# Patient Record
Sex: Male | Born: 1990 | Race: Black or African American | Hispanic: No | Marital: Single | Smoking: Never smoker
Health system: Southern US, Community
[De-identification: ages and names within clinical notes are randomized; demographics above are authoritative.]

---

## 2020-12-25 ENCOUNTER — Inpatient Hospital Stay (HOSPITAL_COMMUNITY): Payer: Non-veteran care

## 2020-12-25 ENCOUNTER — Emergency Department (HOSPITAL_COMMUNITY): Payer: Non-veteran care

## 2020-12-25 ENCOUNTER — Inpatient Hospital Stay (HOSPITAL_COMMUNITY): Payer: Non-veteran care | Admitting: Certified Registered Nurse Anesthetist

## 2020-12-25 ENCOUNTER — Inpatient Hospital Stay (HOSPITAL_COMMUNITY): Payer: Non-veteran care | Admitting: Anesthesiology

## 2020-12-25 ENCOUNTER — Encounter (HOSPITAL_COMMUNITY): Admission: EM | Disposition: A | Discharge status: Rehabilitation Facility | Payer: Self-pay | Source: Home / Self Care

## 2020-12-25 ENCOUNTER — Inpatient Hospital Stay (HOSPITAL_COMMUNITY)
Admission: EM | Admit: 2020-12-25 | Discharge: 2021-01-08 | DRG: 956 | Disposition: A | Discharge status: Rehabilitation Facility | Payer: Non-veteran care | Attending: General Surgery | Admitting: General Surgery

## 2020-12-25 DIAGNOSIS — R42 Dizziness and giddiness: Secondary | ICD-10-CM | POA: Diagnosis not present

## 2020-12-25 DIAGNOSIS — S82202B Unspecified fracture of shaft of left tibia, initial encounter for open fracture type I or II: Secondary | ICD-10-CM | POA: Diagnosis present

## 2020-12-25 DIAGNOSIS — D62 Acute posthemorrhagic anemia: Secondary | ICD-10-CM | POA: Diagnosis present

## 2020-12-25 DIAGNOSIS — S61411A Laceration without foreign body of right hand, initial encounter: Secondary | ICD-10-CM | POA: Diagnosis present

## 2020-12-25 DIAGNOSIS — S270XXA Traumatic pneumothorax, initial encounter: Secondary | ICD-10-CM | POA: Diagnosis present

## 2020-12-25 DIAGNOSIS — S5291XA Unspecified fracture of right forearm, initial encounter for closed fracture: Secondary | ICD-10-CM

## 2020-12-25 DIAGNOSIS — S42121A Displaced fracture of acromial process, right shoulder, initial encounter for closed fracture: Secondary | ICD-10-CM | POA: Diagnosis present

## 2020-12-25 DIAGNOSIS — S82202C Unspecified fracture of shaft of left tibia, initial encounter for open fracture type IIIA, IIIB, or IIIC: Secondary | ICD-10-CM

## 2020-12-25 DIAGNOSIS — S72302C Unspecified fracture of shaft of left femur, initial encounter for open fracture type IIIA, IIIB, or IIIC: Principal | ICD-10-CM | POA: Diagnosis present

## 2020-12-25 DIAGNOSIS — S82402B Unspecified fracture of shaft of left fibula, initial encounter for open fracture type I or II: Secondary | ICD-10-CM | POA: Diagnosis present

## 2020-12-25 DIAGNOSIS — I959 Hypotension, unspecified: Secondary | ICD-10-CM | POA: Diagnosis present

## 2020-12-25 DIAGNOSIS — S51811A Laceration without foreign body of right forearm, initial encounter: Secondary | ICD-10-CM | POA: Diagnosis present

## 2020-12-25 DIAGNOSIS — I748 Embolism and thrombosis of other arteries: Secondary | ICD-10-CM | POA: Diagnosis present

## 2020-12-25 DIAGNOSIS — S143XXD Injury of brachial plexus, subsequent encounter: Secondary | ICD-10-CM | POA: Diagnosis not present

## 2020-12-25 DIAGNOSIS — Z23 Encounter for immunization: Secondary | ICD-10-CM

## 2020-12-25 DIAGNOSIS — S72302S Unspecified fracture of shaft of left femur, sequela: Secondary | ICD-10-CM | POA: Diagnosis not present

## 2020-12-25 DIAGNOSIS — S7290XB Unspecified fracture of unspecified femur, initial encounter for open fracture type I or II: Secondary | ICD-10-CM | POA: Diagnosis present

## 2020-12-25 DIAGNOSIS — Z09 Encounter for follow-up examination after completed treatment for conditions other than malignant neoplasm: Secondary | ICD-10-CM

## 2020-12-25 DIAGNOSIS — T794XXA Traumatic shock, initial encounter: Secondary | ICD-10-CM | POA: Diagnosis present

## 2020-12-25 DIAGNOSIS — T1490XA Injury, unspecified, initial encounter: Secondary | ICD-10-CM

## 2020-12-25 DIAGNOSIS — K5900 Constipation, unspecified: Secondary | ICD-10-CM | POA: Diagnosis present

## 2020-12-25 DIAGNOSIS — S143XXA Injury of brachial plexus, initial encounter: Secondary | ICD-10-CM | POA: Diagnosis present

## 2020-12-25 DIAGNOSIS — K5903 Drug induced constipation: Secondary | ICD-10-CM | POA: Diagnosis not present

## 2020-12-25 DIAGNOSIS — S45891A Other specified injury of other specified blood vessels at shoulder and upper arm level, right arm, initial encounter: Secondary | ICD-10-CM | POA: Diagnosis not present

## 2020-12-25 DIAGNOSIS — S143XXS Injury of brachial plexus, sequela: Secondary | ICD-10-CM | POA: Diagnosis not present

## 2020-12-25 DIAGNOSIS — Z20822 Contact with and (suspected) exposure to covid-19: Secondary | ICD-10-CM | POA: Diagnosis present

## 2020-12-25 DIAGNOSIS — S7292XB Unspecified fracture of left femur, initial encounter for open fracture type I or II: Secondary | ICD-10-CM

## 2020-12-25 DIAGNOSIS — J939 Pneumothorax, unspecified: Secondary | ICD-10-CM

## 2020-12-25 DIAGNOSIS — I6389 Other cerebral infarction: Secondary | ICD-10-CM | POA: Diagnosis not present

## 2020-12-25 DIAGNOSIS — S25101A Unspecified injury of right innominate or subclavian artery, initial encounter: Secondary | ICD-10-CM

## 2020-12-25 DIAGNOSIS — S72002D Fracture of unspecified part of neck of left femur, subsequent encounter for closed fracture with routine healing: Secondary | ICD-10-CM | POA: Diagnosis not present

## 2020-12-25 DIAGNOSIS — S82402C Unspecified fracture of shaft of left fibula, initial encounter for open fracture type IIIA, IIIB, or IIIC: Secondary | ICD-10-CM

## 2020-12-25 HISTORY — PX: FEMUR IM NAIL: SHX1597

## 2020-12-25 HISTORY — PX: VEIN HARVEST: SHX6363

## 2020-12-25 HISTORY — PX: INCISION AND DRAINAGE OF WOUND: SHX1803

## 2020-12-25 HISTORY — PX: TIBIA IM NAIL INSERTION: SHX2516

## 2020-12-25 HISTORY — PX: I & D EXTREMITY: SHX5045

## 2020-12-25 HISTORY — PX: CAROTID-SUBCLAVIAN BYPASS GRAFT: SHX910

## 2020-12-25 HISTORY — PX: SUBCLAVIAN ANGIOGRAM: SHX5350

## 2020-12-25 LAB — COMPREHENSIVE METABOLIC PANEL
ALT: 18 U/L (ref 0–44)
AST: 27 U/L (ref 15–41)
Albumin: 3.9 g/dL (ref 3.5–5.0)
Alkaline Phosphatase: 48 U/L (ref 38–126)
Anion gap: 8 (ref 5–15)
BUN: 10 mg/dL (ref 6–20)
CO2: 22 mmol/L (ref 22–32)
Calcium: 9 mg/dL (ref 8.9–10.3)
Chloride: 107 mmol/L (ref 98–111)
Creatinine, Ser: 1.5 mg/dL — ABNORMAL HIGH (ref 0.61–1.24)
GFR, Estimated: >60 mL/min (ref >60)
Glucose, Bld: 157 mg/dL — ABNORMAL HIGH (ref 70–99)
Potassium: 3.7 mmol/L (ref 3.5–5.1)
Sodium: 137 mmol/L (ref 135–145)
Total Bilirubin: 0.7 mg/dL (ref 0.3–1.2)
Total Protein: 6.6 g/dL (ref 6.5–8.1)

## 2020-12-25 LAB — URINALYSIS, ROUTINE W REFLEX MICROSCOPIC
Bacteria, UA: NONE SEEN
Bilirubin Urine: NEGATIVE
Glucose, UA: NEGATIVE mg/dL
Ketones, ur: NEGATIVE mg/dL
Leukocytes,Ua: NEGATIVE
Nitrite: NEGATIVE
Protein, ur: 30 mg/dL — AB
Specific Gravity, Urine: 1.024 (ref 1.005–1.030)
pH: 6 (ref 5.0–8.0)

## 2020-12-25 LAB — CBC
HCT: 44.2 % (ref 39.0–52.0)
Hemoglobin: 14.6 g/dL (ref 13.0–17.0)
MCH: 32.1 pg (ref 26.0–34.0)
MCHC: 33 g/dL (ref 30.0–36.0)
MCV: 97.1 fL (ref 80.0–100.0)
Platelets: 266 10*3/uL (ref 150–400)
RBC: 4.55 MIL/uL (ref 4.22–5.81)
RDW: 11.7 % (ref 11.5–15.5)
WBC: 6.6 10*3/uL (ref 4.0–10.5)
nRBC: 0 % (ref 0.0–0.2)

## 2020-12-25 LAB — PROTIME-INR
INR: 1.1 (ref 0.8–1.2)
Prothrombin Time: 14 seconds (ref 11.4–15.2)

## 2020-12-25 LAB — I-STAT CHEM 8, ED
BUN: 11 mg/dL (ref 6–20)
Calcium, Ion: 1.2 mmol/L (ref 1.15–1.40)
Chloride: 105 mmol/L (ref 98–111)
Creatinine, Ser: 1.4 mg/dL — ABNORMAL HIGH (ref 0.61–1.24)
Glucose, Bld: 154 mg/dL — ABNORMAL HIGH (ref 70–99)
HCT: 43 % (ref 39.0–52.0)
Hemoglobin: 14.6 g/dL (ref 13.0–17.0)
Potassium: 3.7 mmol/L (ref 3.5–5.1)
Sodium: 141 mmol/L (ref 135–145)
TCO2: 25 mmol/L (ref 22–32)

## 2020-12-25 LAB — RESP PANEL BY RT-PCR (FLU A&B, COVID) ARPGX2
Influenza A by PCR: NEGATIVE
Influenza B by PCR: NEGATIVE
SARS Coronavirus 2 by RT PCR: NEGATIVE

## 2020-12-25 LAB — ABO/RH: ABO/RH(D): O POS

## 2020-12-25 LAB — LACTIC ACID, PLASMA
Lactic Acid, Venous: 2.5 mmol/L (ref 0.5–1.9)
Lactic Acid, Venous: 3.7 mmol/L (ref 0.5–1.9)

## 2020-12-25 LAB — ETHANOL: Alcohol, Ethyl (B): <10 mg/dL (ref <10)

## 2020-12-25 SURGERY — INSERTION, INTRAMEDULLARY ROD, TIBIA
Anesthesia: General | Site: Leg Upper | Laterality: Right

## 2020-12-25 SURGERY — CREATION, BYPASS, ARTERIAL, SUBCLAVIAN TO CAROTID, USING GRAFT
Anesthesia: General | Site: Leg Upper | Laterality: Right

## 2020-12-25 MED ORDER — PAPAVERINE HCL 30 MG/ML IJ SOLN
INTRAMUSCULAR | Status: DC | PRN
  Administered 2020-12-25: 60 mg

## 2020-12-25 MED ORDER — SUGAMMADEX SODIUM 200 MG/2ML IV SOLN
INTRAVENOUS | Status: DC | PRN
  Administered 2020-12-25: 300 mg via INTRAVENOUS

## 2020-12-25 MED ORDER — KETAMINE HCL 50 MG/5ML IJ SOSY: 0.3000 mg/kg | PREFILLED_SYRINGE | Freq: Once | INTRAMUSCULAR | Status: AC

## 2020-12-25 MED ORDER — DEXMEDETOMIDINE HCL IN NACL 200 MCG/50ML IV SOLN
0.4000 ug/kg/h | INTRAVENOUS | Status: DC
  Filled 2020-12-25 (×2): qty 50

## 2020-12-25 MED ORDER — HEPARIN SODIUM (PORCINE) 1000 UNIT/ML IJ SOLN
INTRAMUSCULAR | Status: AC
  Filled 2020-12-25: qty 2

## 2020-12-25 MED ORDER — FENTANYL CITRATE (PF) 250 MCG/5ML IJ SOLN
INTRAMUSCULAR | Status: DC | PRN
  Administered 2020-12-25: 100 ug via INTRAVENOUS
  Administered 2020-12-25 (×3): 50 ug via INTRAVENOUS
  Administered 2020-12-25: 150 ug via INTRAVENOUS
  Administered 2020-12-25: 100 ug via INTRAVENOUS
  Administered 2020-12-25: 50 ug via INTRAVENOUS
  Administered 2020-12-25: 100 ug via INTRAVENOUS

## 2020-12-25 MED ORDER — CEFAZOLIN SODIUM-DEXTROSE 1-4 GM/50ML-% IV SOLN
INTRAVENOUS | Status: AC | PRN
  Administered 2020-12-25: 2 g via INTRAVENOUS

## 2020-12-25 MED ORDER — NITROGLYCERIN IN D5W 200-5 MCG/ML-% IV SOLN
INTRAVENOUS | Status: AC
  Filled 2020-12-25: qty 250

## 2020-12-25 MED ORDER — TETANUS-DIPHTH-ACELL PERTUSSIS 5-2.5-18.5 LF-MCG/0.5 IM SUSY
0.5000 mL | PREFILLED_SYRINGE | Freq: Once | INTRAMUSCULAR | Status: AC
  Administered 2020-12-25: 0.5 mL via INTRAMUSCULAR

## 2020-12-25 MED ORDER — PROTAMINE SULFATE 10 MG/ML IV SOLN
INTRAVENOUS | Status: AC
  Filled 2020-12-25: qty 5

## 2020-12-25 MED ORDER — LIDOCAINE 2% (20 MG/ML) 5 ML SYRINGE
INTRAMUSCULAR | Status: DC | PRN
  Administered 2020-12-25: 20 mg via INTRAVENOUS

## 2020-12-25 MED ORDER — SODIUM CHLORIDE 0.9 % IV SOLN
INTRAVENOUS | Status: AC
  Filled 2020-12-25: qty 1.2

## 2020-12-25 MED ORDER — HYDROMORPHONE HCL 1 MG/ML IJ SOLN
INTRAMUSCULAR | Status: AC
  Filled 2020-12-25: qty 1

## 2020-12-25 MED ORDER — HEPARIN SODIUM (PORCINE) 1000 UNIT/ML IJ SOLN
INTRAMUSCULAR | Status: AC
  Filled 2020-12-25: qty 1

## 2020-12-25 MED ORDER — PHENYLEPHRINE 40 MCG/ML (10ML) SYRINGE FOR IV PUSH (FOR BLOOD PRESSURE SUPPORT)
PREFILLED_SYRINGE | INTRAVENOUS | Status: DC | PRN
  Administered 2020-12-25: 80 ug via INTRAVENOUS

## 2020-12-25 MED ORDER — SUCCINYLCHOLINE CHLORIDE 200 MG/10ML IV SOSY
PREFILLED_SYRINGE | INTRAVENOUS | Status: AC
  Filled 2020-12-25: qty 10

## 2020-12-25 MED ORDER — ROCURONIUM BROMIDE 10 MG/ML (PF) SYRINGE
PREFILLED_SYRINGE | INTRAVENOUS | Status: DC | PRN
  Administered 2020-12-25: 100 mg via INTRAVENOUS
  Administered 2020-12-25 (×2): 50 mg via INTRAVENOUS

## 2020-12-25 MED ORDER — PROTAMINE SULFATE 10 MG/ML IV SOLN
INTRAVENOUS | Status: DC | PRN
  Administered 2020-12-25: 40 mg via INTRAVENOUS

## 2020-12-25 MED ORDER — VASOPRESSIN 20 UNIT/ML IV SOLN
INTRAVENOUS | Status: AC
  Filled 2020-12-25: qty 1

## 2020-12-25 MED ORDER — IODIXANOL 320 MG/ML IV SOLN
INTRAVENOUS | Status: DC | PRN
  Administered 2020-12-25: 18 mL

## 2020-12-25 MED ORDER — SUCCINYLCHOLINE CHLORIDE 200 MG/10ML IV SOSY
PREFILLED_SYRINGE | INTRAVENOUS | Status: DC | PRN
  Administered 2020-12-25: 200 mg via INTRAVENOUS

## 2020-12-25 MED ORDER — SODIUM CHLORIDE 0.9 % IV SOLN
INTRAVENOUS | Status: DC | PRN
  Administered 2020-12-25: 500 mL

## 2020-12-25 MED ORDER — PROPOFOL 10 MG/ML IV BOLUS
INTRAVENOUS | Status: DC | PRN
  Administered 2020-12-25 (×4): 50 mg via INTRAVENOUS

## 2020-12-25 MED ORDER — DEXMEDETOMIDINE (PRECEDEX) IN NS 20 MCG/5ML (4 MCG/ML) IV SYRINGE
PREFILLED_SYRINGE | INTRAVENOUS | Status: DC | PRN
  Administered 2020-12-25: 12 ug via INTRAVENOUS
  Administered 2020-12-25: 8 ug via INTRAVENOUS

## 2020-12-25 MED ORDER — ONDANSETRON HCL 4 MG/2ML IJ SOLN
INTRAMUSCULAR | Status: DC | PRN
  Administered 2020-12-25: 4 mg via INTRAVENOUS

## 2020-12-25 MED ORDER — HEPARIN SODIUM (PORCINE) 1000 UNIT/ML IJ SOLN
INTRAMUSCULAR | Status: DC | PRN
  Administered 2020-12-25: 6000 [IU] via INTRAVENOUS

## 2020-12-25 MED ORDER — VANCOMYCIN HCL 1000 MG IV SOLR
INTRAVENOUS | Status: AC
  Filled 2020-12-25: qty 1000

## 2020-12-25 MED ORDER — LIDOCAINE 2% (20 MG/ML) 5 ML SYRINGE
INTRAMUSCULAR | Status: AC
  Filled 2020-12-25: qty 5

## 2020-12-25 MED ORDER — LACTATED RINGERS IV SOLN: INTRAVENOUS | Status: DC | PRN

## 2020-12-25 MED ORDER — HYDROMORPHONE HCL 1 MG/ML IJ SOLN
0.2500 mg | INTRAMUSCULAR | Status: DC | PRN
  Administered 2020-12-25 – 2020-12-26 (×3): 0.5 mg via INTRAVENOUS

## 2020-12-25 MED ORDER — NITROGLYCERIN FOR UTERINE RELAXATION OPTIME 200MCG/ML
INTRAVENOUS | Status: DC | PRN
  Administered 2020-12-25: 200 ug via INTRAVENOUS

## 2020-12-25 MED ORDER — PAPAVERINE HCL 30 MG/ML IJ SOLN
INTRAMUSCULAR | Status: AC
  Filled 2020-12-25: qty 2

## 2020-12-25 MED ORDER — FENTANYL CITRATE (PF) 250 MCG/5ML IJ SOLN
INTRAMUSCULAR | Status: AC
  Filled 2020-12-25: qty 5

## 2020-12-25 MED ORDER — SODIUM CHLORIDE 0.9 % IR SOLN
Status: DC | PRN
  Administered 2020-12-25 (×4): 3000 mL

## 2020-12-25 MED ORDER — CEFAZOLIN SODIUM-DEXTROSE 2-3 GM-%(50ML) IV SOLR
INTRAVENOUS | Status: DC | PRN
  Administered 2020-12-25 (×2): 2 g via INTRAVENOUS

## 2020-12-25 MED ORDER — SODIUM CHLORIDE 0.9 % IV SOLN: INTRAVENOUS | Status: DC | PRN

## 2020-12-25 MED ORDER — ACETAMINOPHEN 10 MG/ML IV SOLN
INTRAVENOUS | Status: DC | PRN
  Administered 2020-12-25: 1000 mg via INTRAVENOUS

## 2020-12-25 MED ORDER — SODIUM CHLORIDE 0.9 % IV SOLN
INTRAVENOUS | Status: AC | PRN
  Administered 2020-12-25: 2000 mL via INTRAVENOUS

## 2020-12-25 MED ORDER — SODIUM CHLORIDE (PF) 0.9 % IJ SOLN
INTRAMUSCULAR | Status: AC
  Filled 2020-12-25: qty 20

## 2020-12-25 MED ORDER — KETAMINE HCL 50 MG/5ML IJ SOSY
PREFILLED_SYRINGE | INTRAMUSCULAR | Status: AC
  Administered 2020-12-25: 24 mg via INTRAVENOUS
  Filled 2020-12-25: qty 5

## 2020-12-25 MED ORDER — KETAMINE HCL 50 MG/5ML IJ SOSY
PREFILLED_SYRINGE | INTRAMUSCULAR | Status: AC
  Filled 2020-12-25: qty 5

## 2020-12-25 MED ORDER — POTASSIUM CHLORIDE IN NACL 20-0.9 MEQ/L-% IV SOLN
INTRAVENOUS | Status: DC
  Filled 2020-12-25 (×2): qty 1000

## 2020-12-25 MED ORDER — VANCOMYCIN HCL 1000 MG IV SOLR
INTRAVENOUS | Status: DC | PRN
  Administered 2020-12-25: 1000 mg

## 2020-12-25 MED ORDER — ALBUMIN HUMAN 5 % IV SOLN: INTRAVENOUS | Status: DC | PRN

## 2020-12-25 MED ORDER — ACETAMINOPHEN 10 MG/ML IV SOLN
INTRAVENOUS | Status: AC
  Filled 2020-12-25: qty 100

## 2020-12-25 MED ORDER — FENTANYL CITRATE (PF) 100 MCG/2ML IJ SOLN
INTRAMUSCULAR | Status: AC | PRN
  Administered 2020-12-25 (×2): 50 ug via INTRAVENOUS

## 2020-12-25 MED ORDER — 0.9 % SODIUM CHLORIDE (POUR BTL) OPTIME
TOPICAL | Status: DC | PRN
  Administered 2020-12-25: 2000 mL

## 2020-12-25 MED ORDER — PHENYLEPHRINE 40 MCG/ML (10ML) SYRINGE FOR IV PUSH (FOR BLOOD PRESSURE SUPPORT)
PREFILLED_SYRINGE | INTRAVENOUS | Status: AC
  Filled 2020-12-25: qty 10

## 2020-12-25 MED ORDER — MIDAZOLAM HCL 5 MG/5ML IJ SOLN
INTRAMUSCULAR | Status: DC | PRN
  Administered 2020-12-25: 2 mg via INTRAVENOUS

## 2020-12-25 MED ORDER — ROCURONIUM BROMIDE 10 MG/ML (PF) SYRINGE
PREFILLED_SYRINGE | INTRAVENOUS | Status: AC
  Filled 2020-12-25: qty 20

## 2020-12-25 MED ORDER — PHENYLEPHRINE HCL-NACL 10-0.9 MG/250ML-% IV SOLN
INTRAVENOUS | Status: DC | PRN
  Administered 2020-12-25: 20 ug/min via INTRAVENOUS
  Administered 2020-12-25: 40 ug/min via INTRAVENOUS

## 2020-12-25 MED ORDER — MIDAZOLAM HCL 2 MG/2ML IJ SOLN
INTRAMUSCULAR | Status: AC
  Filled 2020-12-25: qty 2

## 2020-12-25 MED ORDER — CEFAZOLIN SODIUM 1 G IJ SOLR
INTRAMUSCULAR | Status: AC
  Filled 2020-12-25: qty 40

## 2020-12-25 MED ORDER — TRANEXAMIC ACID 1000 MG/10ML IV SOLN
1.5000 mg/kg/h | INTRAVENOUS | Status: DC
  Filled 2020-12-25: qty 25

## 2020-12-25 MED ORDER — IOHEXOL 350 MG/ML SOLN
100.0000 mL | Freq: Once | INTRAVENOUS | Status: AC | PRN
  Administered 2020-12-25: 100 mL via INTRAVENOUS

## 2020-12-25 MED ORDER — KETAMINE HCL 10 MG/ML IJ SOLN
INTRAMUSCULAR | Status: DC | PRN
  Administered 2020-12-25: 30 mg via INTRAVENOUS

## 2020-12-25 MED ORDER — FENTANYL CITRATE (PF) 100 MCG/2ML IJ SOLN
INTRAMUSCULAR | Status: AC
  Filled 2020-12-25: qty 2

## 2020-12-25 SURGICAL SUPPLY — 121 items
ADH SKN CLS APL DERMABOND .7 (GAUZE/BANDAGES/DRESSINGS) ×3
APL PRP STRL LF DISP 70% ISPRP (MISCELLANEOUS)
BAG BANDED W/RUBBER/TAPE 36X54 (MISCELLANEOUS) ×4 IMPLANT
BAG DECANTER FOR FLEXI CONT (MISCELLANEOUS) ×4 IMPLANT
BAG EQP BAND 135X91 W/RBR TAPE (MISCELLANEOUS) ×3
BAG ISL DRAPE 18X18 STRL (DRAPES) ×3
BAG ISOLATION DRAPE 18X18 (DRAPES) ×3 IMPLANT
BLADE SURG 10 STRL SS (BLADE) IMPLANT
BLANKET WARM CARDIAC ADLT BAIR (MISCELLANEOUS) ×4 IMPLANT
BNDG COHESIVE 4X5 TAN STRL (GAUZE/BANDAGES/DRESSINGS) ×4 IMPLANT
BNDG ELASTIC 4X5.8 VLCR STR LF (GAUZE/BANDAGES/DRESSINGS) IMPLANT
BNDG ELASTIC 6X5.8 VLCR STR LF (GAUZE/BANDAGES/DRESSINGS) IMPLANT
BNDG GAUZE ELAST 4 BULKY (GAUZE/BANDAGES/DRESSINGS) ×16 IMPLANT
BRUSH SCRUB EZ PLAIN DRY (MISCELLANEOUS) ×8 IMPLANT
CANISTER SUCT 3000ML PPV (MISCELLANEOUS) ×4 IMPLANT
CANNULA VESSEL 3MM 2 BLNT TIP (CANNULA) ×4 IMPLANT
CATH ANGIO BERNSTEIN 5X65X.035 (CATHETERS) ×4 IMPLANT
CATH ROBINSON RED A/P 18FR (CATHETERS) ×8 IMPLANT
CHLORAPREP W/TINT 26 (MISCELLANEOUS) IMPLANT
CLIP VESOCCLUDE MED 24/CT (CLIP) ×4 IMPLANT
CLIP VESOCCLUDE SM WIDE 24/CT (CLIP) ×8 IMPLANT
CNTNR URN SCR LID CUP LEK RST (MISCELLANEOUS) ×3 IMPLANT
CONT SPEC 4OZ STRL OR WHT (MISCELLANEOUS) ×4
COVER DOME SNAP 22 D (MISCELLANEOUS) ×4 IMPLANT
COVER MAYO STAND STRL (DRAPES) ×4 IMPLANT
COVER SURGICAL LIGHT HANDLE (MISCELLANEOUS) ×4 IMPLANT
COVER WAND RF STERILE (DRAPES) ×8 IMPLANT
DERMABOND ADVANCED (GAUZE/BANDAGES/DRESSINGS) ×1
DERMABOND ADVANCED .7 DNX12 (GAUZE/BANDAGES/DRESSINGS) ×3 IMPLANT
DEVICE TORQUE H2O (MISCELLANEOUS) ×4 IMPLANT
DRAIN CHANNEL 15F RND FF W/TCR (WOUND CARE) IMPLANT
DRAPE C-ARM 42X72 X-RAY (DRAPES) ×4 IMPLANT
DRAPE C-ARMOR (DRAPES) ×4 IMPLANT
DRAPE HALF SHEET 40X57 (DRAPES) ×8 IMPLANT
DRAPE IMP U-DRAPE 54X76 (DRAPES) IMPLANT
DRAPE INCISE IOBAN 66X45 STRL (DRAPES) IMPLANT
DRAPE ISOLATION BAG 18X18 (DRAPES) ×1
DRAPE ORTHO SPLIT 77X108 STRL (DRAPES) ×8
DRAPE SURG 17X23 STRL (DRAPES) ×4 IMPLANT
DRAPE SURG ORHT 6 SPLT 77X108 (DRAPES) ×6 IMPLANT
DRAPE U-SHAPE 47X51 STRL (DRAPES) ×4 IMPLANT
DRSG ADAPTIC 3X8 NADH LF (GAUZE/BANDAGES/DRESSINGS) ×4 IMPLANT
DRSG COVADERM 4X10 (GAUZE/BANDAGES/DRESSINGS) ×12 IMPLANT
DRSG COVADERM 4X6 (GAUZE/BANDAGES/DRESSINGS) ×4 IMPLANT
ELECT REM PT RETURN 9FT ADLT (ELECTROSURGICAL) ×8
ELECTRODE REM PT RTRN 9FT ADLT (ELECTROSURGICAL) ×6 IMPLANT
EVACUATOR 1/8 PVC DRAIN (DRAIN) IMPLANT
EVACUATOR SILICONE 100CC (DRAIN) IMPLANT
GAUZE SPONGE 4X4 12PLY STRL (GAUZE/BANDAGES/DRESSINGS) ×8 IMPLANT
GLIDEWIRE ADV .035X180CM (WIRE) ×4 IMPLANT
GLOVE BIO SURGEON STRL SZ 6.5 (GLOVE) ×12 IMPLANT
GLOVE BIO SURGEON STRL SZ7.5 (GLOVE) ×20 IMPLANT
GLOVE BIOGEL PI IND STRL 7.5 (GLOVE) ×3 IMPLANT
GLOVE BIOGEL PI INDICATOR 7.5 (GLOVE) ×1
GLOVE SRG 8 PF TXTR STRL LF DI (GLOVE) ×3 IMPLANT
GLOVE SURG UNDER POLY LF SZ6.5 (GLOVE) ×4 IMPLANT
GLOVE SURG UNDER POLY LF SZ8 (GLOVE) ×4
GOWN STRL REUS W/ TWL LRG LVL3 (GOWN DISPOSABLE) ×15 IMPLANT
GOWN STRL REUS W/TWL LRG LVL3 (GOWN DISPOSABLE) ×20
HANDPIECE INTERPULSE COAX TIP (DISPOSABLE)
INSERT FOGARTY SM (MISCELLANEOUS) ×4 IMPLANT
KIT BASIN OR (CUSTOM PROCEDURE TRAY) ×8 IMPLANT
KIT DRAIN CSF ACCUDRAIN (MISCELLANEOUS) IMPLANT
KIT MICROPUNCTURE NIT STIFF (SHEATH) ×8 IMPLANT
KIT TURNOVER KIT B (KITS) ×8 IMPLANT
LOOP VESSEL MAXI BLUE (MISCELLANEOUS) ×8 IMPLANT
MANIFOLD NEPTUNE II (INSTRUMENTS) IMPLANT
NEEDLE 18GX1X1/2 (RX/OR ONLY) (NEEDLE) ×4 IMPLANT
NEEDLE 22X1 1/2 (OR ONLY) (NEEDLE) ×4 IMPLANT
NS IRRIG 1000ML POUR BTL (IV SOLUTION) ×12 IMPLANT
PACK CAROTID (CUSTOM PROCEDURE TRAY) ×4 IMPLANT
PACK ORTHO EXTREMITY (CUSTOM PROCEDURE TRAY) IMPLANT
PACK TOTAL JOINT (CUSTOM PROCEDURE TRAY) IMPLANT
PAD ARMBOARD 7.5X6 YLW CONV (MISCELLANEOUS) ×16 IMPLANT
PADDING CAST COTTON 6X4 STRL (CAST SUPPLIES) IMPLANT
POSITIONER HEAD DONUT 9IN (MISCELLANEOUS) ×4 IMPLANT
SET HNDPC FAN SPRY TIP SCT (DISPOSABLE) IMPLANT
SHEATH BRITE TIP 7FRX11 (SHEATH) ×4 IMPLANT
SHEATH PINNACLE 5F 10CM (SHEATH) ×12 IMPLANT
SHUNT CAROTID BYPASS 10 (VASCULAR PRODUCTS) IMPLANT
SHUNT CAROTID BYPASS 12 (VASCULAR PRODUCTS) IMPLANT
SPONGE LAP 18X18 RF (DISPOSABLE) ×8 IMPLANT
SPONGE SURGIFOAM ABS GEL 100 (HEMOSTASIS) IMPLANT
STAPLER VISISTAT 35W (STAPLE) ×8 IMPLANT
STOCKINETTE 4X48 STRL (DRAPES) ×4 IMPLANT
STOPCOCK 4 WAY LG BORE MALE ST (IV SETS) ×8 IMPLANT
SUT ETHILON 2 0 FS 18 (SUTURE) IMPLANT
SUT ETHILON 3 0 PS 1 (SUTURE) IMPLANT
SUT MNCRL AB 3-0 PS2 18 (SUTURE) ×4 IMPLANT
SUT MNCRL AB 4-0 PS2 18 (SUTURE) ×4 IMPLANT
SUT MON AB 2-0 CT1 36 (SUTURE) IMPLANT
SUT PDS AB 0 CT 36 (SUTURE) IMPLANT
SUT PROLENE 5 0 C 1 24 (SUTURE) ×4 IMPLANT
SUT PROLENE 6 0 BV (SUTURE) ×20 IMPLANT
SUT PROLENE 7 0 BV 1 (SUTURE) IMPLANT
SUT SILK 2 0 PERMA HAND 18 BK (SUTURE) IMPLANT
SUT SILK 2 0 SH (SUTURE) ×4 IMPLANT
SUT SILK 3 0 (SUTURE) ×4
SUT SILK 3-0 18XBRD TIE 12 (SUTURE) ×3 IMPLANT
SUT VIC AB 0 CT1 27 (SUTURE)
SUT VIC AB 0 CT1 27XBRD ANBCTR (SUTURE) IMPLANT
SUT VIC AB 2-0 CT1 27 (SUTURE)
SUT VIC AB 2-0 CT1 TAPERPNT 27 (SUTURE) IMPLANT
SUT VIC AB 3-0 SH 27 (SUTURE) ×16
SUT VIC AB 3-0 SH 27X BRD (SUTURE) ×12 IMPLANT
SWAB CULTURE ESWAB REG 1ML (MISCELLANEOUS) IMPLANT
SYR 10ML LL (SYRINGE) ×12 IMPLANT
SYR 20CC LL (SYRINGE) ×8 IMPLANT
SYR 3ML LL SCALE MARK (SYRINGE) ×4 IMPLANT
SYR CONTROL 10ML LL (SYRINGE) ×4 IMPLANT
TOWEL GREEN STERILE (TOWEL DISPOSABLE) ×12 IMPLANT
TOWEL GREEN STERILE FF (TOWEL DISPOSABLE) ×4 IMPLANT
TRAY FOLEY MTR SLVR 16FR STAT (SET/KITS/TRAYS/PACK) ×4 IMPLANT
TUBE CONNECTING 12X1/4 (SUCTIONS) ×4 IMPLANT
TUBE CONNECTING 20X1/4 (TUBING) ×4 IMPLANT
TUBING CIL FLEX 10 FLL-RA (TUBING) ×4 IMPLANT
TUBING INJECTOR 48 (MISCELLANEOUS) ×4 IMPLANT
UNDERPAD 30X36 HEAVY ABSORB (UNDERPADS AND DIAPERS) ×4 IMPLANT
WATER STERILE IRR 1000ML POUR (IV SOLUTION) ×8 IMPLANT
WIRE STARTER BENTSON 035X150 (WIRE) ×4 IMPLANT
YANKAUER SUCT BULB TIP NO VENT (SUCTIONS) IMPLANT

## 2020-12-25 SURGICAL SUPPLY — 87 items
APL PRP STRL LF DISP 70% ISPRP (MISCELLANEOUS) ×3
BIT DRILL CALIBRATED 4.2 (BIT) ×6 IMPLANT
BIT DRILL SHORT 4.2 (BIT) ×9 IMPLANT
BLADE SURG 10 STRL SS (BLADE) ×12 IMPLANT
BNDG CMPR MED 10X6 ELC LF (GAUZE/BANDAGES/DRESSINGS) ×3
BNDG COHESIVE 4X5 TAN STRL (GAUZE/BANDAGES/DRESSINGS) ×4 IMPLANT
BNDG COHESIVE 6X5 TAN STRL LF (GAUZE/BANDAGES/DRESSINGS) IMPLANT
BNDG ELASTIC 4X5.8 VLCR STR LF (GAUZE/BANDAGES/DRESSINGS) ×4 IMPLANT
BNDG ELASTIC 6X10 VLCR STRL LF (GAUZE/BANDAGES/DRESSINGS) ×4 IMPLANT
BNDG ELASTIC 6X5.8 VLCR STR LF (GAUZE/BANDAGES/DRESSINGS) ×4 IMPLANT
BNDG GAUZE ELAST 4 BULKY (GAUZE/BANDAGES/DRESSINGS) ×4 IMPLANT
BRUSH SCRUB EZ PLAIN DRY (MISCELLANEOUS) ×16 IMPLANT
CANISTER WOUND CARE 500ML ATS (WOUND CARE) ×4 IMPLANT
CHLORAPREP W/TINT 26 (MISCELLANEOUS) ×4 IMPLANT
COVER SURGICAL LIGHT HANDLE (MISCELLANEOUS) ×8 IMPLANT
COVER WAND RF STERILE (DRAPES) ×4 IMPLANT
DRAPE C-ARM 35X43 STRL (DRAPES) ×4 IMPLANT
DRAPE C-ARM 42X72 X-RAY (DRAPES) ×4 IMPLANT
DRAPE C-ARMOR (DRAPES) ×4 IMPLANT
DRAPE HALF SHEET 40X57 (DRAPES) ×16 IMPLANT
DRAPE IMP U-DRAPE 54X76 (DRAPES) ×8 IMPLANT
DRAPE INCISE IOBAN 66X45 STRL (DRAPES) ×4 IMPLANT
DRAPE ORTHO SPLIT 77X108 STRL (DRAPES) ×8
DRAPE SURG 17X23 STRL (DRAPES) ×4 IMPLANT
DRAPE SURG ORHT 6 SPLT 77X108 (DRAPES) ×6 IMPLANT
DRAPE U-SHAPE 47X51 STRL (DRAPES) ×4 IMPLANT
DRESSING MEPILEX FLEX 4X4 (GAUZE/BANDAGES/DRESSINGS) ×3 IMPLANT
DRESSING PEEL AND PLC PRVNA 13 (GAUZE/BANDAGES/DRESSINGS) ×3 IMPLANT
DRILL BIT CALIBRATED 4.2 (BIT) ×8
DRILL BIT SHORT 4.2 (BIT) ×12
DRSG ADAPTIC 3X8 NADH LF (GAUZE/BANDAGES/DRESSINGS) ×4 IMPLANT
DRSG COVADERM 4X10 (GAUZE/BANDAGES/DRESSINGS) ×4 IMPLANT
DRSG MEPILEX BORDER 4X4 (GAUZE/BANDAGES/DRESSINGS) ×12 IMPLANT
DRSG MEPILEX BORDER 4X8 (GAUZE/BANDAGES/DRESSINGS) ×4 IMPLANT
DRSG MEPILEX FLEX 4X4 (GAUZE/BANDAGES/DRESSINGS) ×4
DRSG MEPITEL 4X7.2 (GAUZE/BANDAGES/DRESSINGS) ×8 IMPLANT
DRSG PEEL AND PLACE PREVENA 13 (GAUZE/BANDAGES/DRESSINGS) ×4
ELECT REM PT RETURN 9FT ADLT (ELECTROSURGICAL) ×4
ELECTRODE REM PT RTRN 9FT ADLT (ELECTROSURGICAL) ×3 IMPLANT
GAUZE SPONGE 4X4 12PLY STRL (GAUZE/BANDAGES/DRESSINGS) ×4 IMPLANT
GAUZE SPONGE 4X4 12PLY STRL LF (GAUZE/BANDAGES/DRESSINGS) ×4 IMPLANT
GLOVE BIO SURGEON STRL SZ 6.5 (GLOVE) ×20 IMPLANT
GLOVE BIO SURGEON STRL SZ7.5 (GLOVE) ×24 IMPLANT
GLOVE BIOGEL PI IND STRL 7.5 (GLOVE) ×6 IMPLANT
GLOVE BIOGEL PI INDICATOR 7.5 (GLOVE) ×2
GLOVE SURG ENC MOIS LTX SZ8.5 (GLOVE) ×8 IMPLANT
GLOVE SURG UNDER POLY LF SZ6.5 (GLOVE) ×4 IMPLANT
GOWN STRL REUS W/ TWL LRG LVL3 (GOWN DISPOSABLE) ×15 IMPLANT
GOWN STRL REUS W/ TWL XL LVL3 (GOWN DISPOSABLE) ×3 IMPLANT
GOWN STRL REUS W/TWL LRG LVL3 (GOWN DISPOSABLE) ×20
GOWN STRL REUS W/TWL XL LVL3 (GOWN DISPOSABLE) ×4
GUIDEWIRE 3.2X400 (WIRE) ×8 IMPLANT
KIT BASIN OR (CUSTOM PROCEDURE TRAY) ×4 IMPLANT
KIT TURNOVER KIT B (KITS) ×4 IMPLANT
MANIFOLD NEPTUNE II (INSTRUMENTS) ×4 IMPLANT
NAIL RFNA BEND 9X380 5D (Nail) ×4 IMPLANT
NAIL TIB TFNA STRL 9X360 (Nail) ×4 IMPLANT
NS IRRIG 1000ML POUR BTL (IV SOLUTION) ×4 IMPLANT
PACK GENERAL/GYN (CUSTOM PROCEDURE TRAY) ×4 IMPLANT
PACK TOTAL JOINT (CUSTOM PROCEDURE TRAY) ×4 IMPLANT
PAD ARMBOARD 7.5X6 YLW CONV (MISCELLANEOUS) ×8 IMPLANT
PAD CAST 4YDX4 CTTN HI CHSV (CAST SUPPLIES) ×3 IMPLANT
PADDING CAST COTTON 4X4 STRL (CAST SUPPLIES) ×4
PADDING CAST COTTON 6X4 STRL (CAST SUPPLIES) ×4 IMPLANT
REAMER ROD DEEP FLUTE 2.5X950 (INSTRUMENTS) ×4 IMPLANT
SCREW LOCK IM 5X36 (Screw) ×4 IMPLANT
SCREW LOCK IM 5X38X125 (Screw) ×4 IMPLANT
SCREW LOCK IM 5X74 (Screw) ×4 IMPLANT
SCREW LOCK IM NAIL 5.0X34 (Screw) ×4 IMPLANT
SCREW LOCK IM NAIL 5X50 (Screw) ×4 IMPLANT
SCREW LOCK IM NAIL 5X70 (Screw) ×8 IMPLANT
SCREW LOCK IM TI 5X32 (Screw) ×4 IMPLANT
SCREW LOCK IM TI 5X40 (Screw) ×4 IMPLANT
SCREW LOCK X25 36X5X IM (Screw) ×3 IMPLANT
STAPLER VISISTAT 35W (STAPLE) ×4 IMPLANT
STOCKINETTE IMPERVIOUS LG (DRAPES) ×4 IMPLANT
SUT ETHILON 3 0 PS 1 (SUTURE) ×44 IMPLANT
SUT MON AB 2-0 CT1 36 (SUTURE) ×12 IMPLANT
SUT VIC AB 0 CT1 27 (SUTURE)
SUT VIC AB 0 CT1 27XBRD ANBCTR (SUTURE) IMPLANT
SUT VIC AB 2-0 CT1 27 (SUTURE) ×8
SUT VIC AB 2-0 CT1 TAPERPNT 27 (SUTURE) ×6 IMPLANT
TOWEL GREEN STERILE (TOWEL DISPOSABLE) ×8 IMPLANT
TOWEL GREEN STERILE FF (TOWEL DISPOSABLE) ×4 IMPLANT
UNDERPAD 30X36 HEAVY ABSORB (UNDERPADS AND DIAPERS) ×4 IMPLANT
WATER STERILE IRR 1000ML POUR (IV SOLUTION) ×4 IMPLANT
YANKAUER SUCT BULB TIP NO VENT (SUCTIONS) IMPLANT

## 2020-12-25 NOTE — Transfer of Care (Signed)
Immediate Anesthesia Transfer of Care Note  Patient: Robert Good  Procedure(s) Performed: REPAIR RIGHT SUBCLAVIAN ARTERY TRANSECTION  WITH RIGHT SUBCLAVIAN ARTERY TO RIGHT BRACHIAL ARTERY BYPASS USING RIGHT GREATER SAPHENOUS VEIN (Right Chest) HARVEST OF RIGHT GREATER SAPHENOUS VEIN (Right Leg Upper) ANGIOGRAM OF RIGHT BRACHIAL ARTERY AND INNOMINATE ARTERY (Right Chest)  Patient Location: PACU  Anesthesia Type:General  Level of Consciousness: drowsy  Airway & Oxygen Therapy: Patient Spontanous Breathing and Patient connected to face mask oxygen  Post-op Assessment: Report given to RN and Post -op Vital signs reviewed and stable  Post vital signs: Reviewed and stable  Last Vitals:  Vitals Value Taken Time  BP 113/73 12/25/20 2309  Temp    Pulse 77 12/25/20 2312  Resp 19 12/25/20 2315  SpO2 100 % 12/25/20 2312  Vitals shown include unvalidated device data.  Last Pain:  Vitals:   12/25/20 1210  TempSrc:   PainSc: 10-Worst pain ever         Complications: No complications documented.

## 2020-12-25 NOTE — Anesthesia Procedure Notes (Signed)
Arterial Line Insertion Start/End5/20/2022 1:45 PM, 12/25/2020 1:49 PM Performed by: Loma Sousa T, CRNA  Left, radial was placed Catheter size: 20 G Hand hygiene performed  and maximum sterile barriers used   Attempts: 1 Procedure performed without using ultrasound guided technique. Ultrasound Notes:anatomy identified Following insertion, dressing applied and Biopatch.

## 2020-12-25 NOTE — Progress Notes (Signed)
Orthopedic Tech Progress Note Patient Details:  Robert Good 1991-04-09 [id number] Was told by nurse to just apply Buck's traction and not apply the short leg splint  Musculoskeletal Traction Type of Traction: Bucks Skin Traction Traction Location: LLE Traction Weight: 15 lbs   Post Interventions Patient Tolerated: Fair Instructions Provided: Adjustment of device   [employer] 12/25/2020, 1:25 PM

## 2020-12-25 NOTE — Transfer of Care (Signed)
Immediate Anesthesia Transfer of Care Note  Patient: Robert Good  Procedure(s) Performed: INTRAMEDULLARY (IM) NAIL TIBIAL (Left Leg Lower) INTRAMEDULLARY (IM) NAIL FEMORAL (Left Leg Upper) IRRIGATION AND DEBRIDEMENT LEFT TIB/FIB, FEMUR (Left Leg Lower) IRRIGATION AND DEBRIDEMENT AND CLOSURE OF LACERATIONS OF  HAND AND ARM (Right Arm Lower)  Patient Location: PACU  Anesthesia Type:General  Level of Consciousness: drowsy  Airway & Oxygen Therapy: Patient Spontanous Breathing and Patient connected to face mask oxygen  Post-op Assessment: Report given to RN and Post -op Vital signs reviewed and stable  Post vital signs: Reviewed and stable  Last Vitals:  Vitals Value Taken Time  BP 113/73 12/25/20 2309  Temp    Pulse 77 12/25/20 2312  Resp 15 12/25/20 2313  SpO2 100 % 12/25/20 2312  Vitals shown include unvalidated device data.  Last Pain:  Vitals:   12/25/20 1210  TempSrc:   PainSc: 10-Worst pain ever         Complications: No complications documented.
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
## 2020-12-25 NOTE — ED Notes (Signed)
Fast exam Negative
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
## 2020-12-25 NOTE — ED Notes (Signed)
Patient transported to CT 

## 2020-12-25 NOTE — Anesthesia Preprocedure Evaluation (Signed)
Anesthesia Evaluation  Patient identified by MRN, date of birth, ID band Patient awake    Reviewed: Allergy & Precautions, NPO status , Patient's Chart, lab work & pertinent test resultsPreop documentation limited or incomplete due to emergent nature of procedure.  History of Anesthesia Complications Negative for: history of anesthetic complications  Airway Mallampati: II  TM Distance: >3 FB Neck ROM: Full    Dental  (+) Dental Advisory Given   Pulmonary neg pulmonary ROS,  12/25/2020 SARS coronavirus NEG R ptx   breath sounds clear to auscultation       Cardiovascular  Rhythm:Regular Rate:Tachycardia  R subclavian artery injury: s/p 3 pRBC, 2 FFP in ED   Neuro/Psych  C-spine not cleared    GI/Hepatic negative GI ROS, Neg liver ROS,   Endo/Other  negative endocrine ROS  Renal/GU negative Renal ROS     Musculoskeletal R acromion fracture R subclavian artery injury R brachial plexus injury L femur fracture L tib/fib   Abdominal   Peds  Hematology transfusing   Anesthesia Other Findings   Reproductive/Obstetrics                             Anesthesia Physical Anesthesia Plan  ASA: II and emergent  Anesthesia Plan: General   Post-op Pain Management:    Induction: Intravenous  PONV Risk Score and Plan: 2 and Ondansetron, Aprepitant and Treatment may vary due to age or medical condition  Airway Management Planned: Oral ETT and Video Laryngoscope Planned  Additional Equipment: Arterial line, CVP and Ultrasound Guidance Line Placement  Intra-op Plan:   Post-operative Plan: Possible Post-op intubation/ventilation  Informed Consent: I have reviewed the patients History and Physical, chart, labs and discussed the procedure including the risks, benefits and alternatives for the proposed anesthesia with the patient or authorized representative who has indicated his/her understanding and  acceptance.     Dental advisory given  Plan Discussed with: CRNA and Surgeon  Anesthesia Plan Comments:         Anesthesia Quick Evaluation

## 2020-12-25 NOTE — Anesthesia Procedure Notes (Signed)
Procedure Name: Intubation Date/Time: 12/25/2020 1:40 PM Performed by: Sheppard Evens, CRNA Pre-anesthesia Checklist: Patient identified, Emergency Drugs available, Suction available and Patient being monitored Patient Re-evaluated:Patient Re-evaluated prior to induction Oxygen Delivery Method: Circle System Utilized Preoxygenation: Pre-oxygenation with 100% oxygen Induction Type: IV induction and Rapid sequence Laryngoscope Size: Glidescope and 4 Grade View: Grade I Tube type: Oral Tube size: 7.5 mm Number of attempts: 1 Airway Equipment and Method: Stylet and Oral airway Placement Confirmation: ETT inserted through vocal cords under direct vision,  positive ETCO2 and breath sounds checked- equal and bilateral Secured at: 22 cm Tube secured with: Tape Dental Injury: Teeth and Oropharynx as per pre-operative assessment
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
## 2020-12-25 NOTE — TOC CAGE-AID Note (Signed)
Transition of Care [facility]) - CAGE-AID Screening   Patient Details  Name: Robert Good MRN: [id number] Date of Birth: 01/10/1991  Hewitt Shorts, RN  Trauma Response Nurse Phone Number:[phone] 12/25/2020, 2:31 PM        CAGE-AID Screening:    Have You Ever Felt You Ought to Cut Down on Your Drinking or Drug Use?: No Have People Annoyed You By Critizing Your Drinking Or Drug Use?: No Have You Felt Bad Or Guilty About Your Drinking Or Drug Use?: No Have You Ever Had a Drink or Used Drugs First Thing In The Morning to Steady Your Nerves or to Get Rid of a Hangover?: No CAGE-AID Score: 0  Substance Abuse Education Offered: No (Pt denies alcohol and drug use)

## 2020-12-25 NOTE — Progress Notes (Signed)
   12/25/20 1130  Clinical Encounter Type  Visited With Patient not available  Visit Type Trauma  Referral From Nurse  Consult/Referral To Chaplain  Chaplain responded. Patient is being attended by medical team. There was no family present. Joaquin Courts, Lydia, chaplain remains available if needed. This note was prepared by Deneen Harts, M.Div..  For questions please contact by phone [phone].
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
## 2020-12-25 NOTE — Anesthesia Preprocedure Evaluation (Signed)
Anesthesia Evaluation  Patient identified by MRN, date of birth, ID band Patient unresponsive    Reviewed: Allergy & Precautions, H&P , NPO status , Patient's Chart, lab work & pertinent test results  Airway Mallampati: Intubated       Dental no notable dental hx. (+) Teeth Intact, Dental Advisory Given   Pulmonary neg pulmonary ROS,    Pulmonary exam normal breath sounds clear to auscultation       Cardiovascular negative cardio ROS   Rhythm:Regular Rate:Normal     Neuro/Psych negative neurological ROS  negative psych ROS   GI/Hepatic negative GI ROS, Neg liver ROS,   Endo/Other  negative endocrine ROS  Renal/GU negative Renal ROS  negative genitourinary   Musculoskeletal   Abdominal   Peds  Hematology negative hematology ROS (+)   Anesthesia Other Findings   Reproductive/Obstetrics negative OB ROS                             Anesthesia Physical Anesthesia Plan  ASA: II and emergent  Anesthesia Plan: General   Post-op Pain Management:    Induction: Intravenous  PONV Risk Score and Plan: 2 and Treatment may vary due to age or medical condition  Airway Management Planned: Oral ETT  Additional Equipment:   Intra-op Plan:   Post-operative Plan: Possible Post-op intubation/ventilation  Informed Consent: I have reviewed the patients History and Physical, chart, labs and discussed the procedure including the risks, benefits and alternatives for the proposed anesthesia with the patient or authorized representative who has indicated his/her understanding and acceptance.     Dental advisory given  Plan Discussed with: CRNA  Anesthesia Plan Comments:         Anesthesia Quick Evaluation

## 2020-12-25 NOTE — Anesthesia Procedure Notes (Signed)
Central Venous Catheter Insertion Performed by: Annye Asa, MD, anesthesiologist Start/End5/20/2022 1:51 PM, 12/25/2020 2:07 PM Patient location: OR. Preanesthetic checklist: patient identified, IV checked, site marked, risks and benefits discussed, surgical consent, monitors and equipment checked, pre-op evaluation, timeout performed and anesthesia consent Position: supine Hand hygiene performed  and maximum sterile barriers used  Catheter size: 8.5 Fr Central line was placed.Sheath introducer Procedure performed using ultrasound guided technique. Ultrasound Notes:anatomy identified, needle tip was noted to be adjacent to the nerve/plexus identified, no ultrasound evidence of intravascular and/or intraneural injection and image(s) printed for medical record Attempts: 1 Following insertion, dressing applied and Biopatch. Post procedure assessment: blood return through all ports, free fluid flow and no air  Patient tolerated the procedure well with no immediate complications. Additional procedure comments: CVP: Timeout, sterile prep, drape, FBP L neck.  Supine position. finder and trocar LIJ 1st pass with US guidance.  Cordis introducer placed over J wire. SLIC placed in introducer. Biopatch and sterile dressing on.  Patient tolerated well.  VSS.  Jenita Seashore, MD.

## 2020-12-25 NOTE — OR Nursing (Signed)
Patient transferred from OR 16 to OR 5 for repair of orthopedic injuries. Patient stable and intubated during transfer. Dr. Jena Gauss and Dr. Edilia Bo made aware. Report given to A. Margo Aye, Rn on completion of transfer.
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
## 2020-12-26 ENCOUNTER — Inpatient Hospital Stay (HOSPITAL_COMMUNITY): Payer: Non-veteran care

## 2020-12-26 LAB — BPAM PLATELET PHERESIS
Blood Product Expiration Date: 202205222359
Blood Product Expiration Date: 202205222359
ISSUE DATE / TIME: 202205201459
ISSUE DATE / TIME: 202205201459
Unit Type and Rh: 5100
Unit Type and Rh: 5100

## 2020-12-26 LAB — POCT I-STAT 7, (LYTES, BLD GAS, ICA,H+H)
Acid-Base Excess: 1 mmol/L (ref 0.0–2.0)
Acid-Base Excess: 2 mmol/L (ref 0.0–2.0)
Acid-Base Excess: 3 mmol/L — ABNORMAL HIGH (ref 0.0–2.0)
Acid-Base Excess: 3 mmol/L — ABNORMAL HIGH (ref 0.0–2.0)
Bicarbonate: 27.1 mmol/L (ref 20.0–28.0)
Bicarbonate: 26.5 mmol/L (ref 20.0–28.0)
Bicarbonate: 27.3 mmol/L (ref 20.0–28.0)
Bicarbonate: 27.7 mmol/L (ref 20.0–28.0)
Calcium, Ion: 1.2 mmol/L (ref 1.15–1.40)
Calcium, Ion: 1.16 mmol/L (ref 1.15–1.40)
Calcium, Ion: 1.09 mmol/L — ABNORMAL LOW (ref 1.15–1.40)
Calcium, Ion: 1.05 mmol/L — ABNORMAL LOW (ref 1.15–1.40)
HCT: 39 % (ref 39.0–52.0)
HCT: 30 % — ABNORMAL LOW (ref 39.0–52.0)
HCT: 32 % — ABNORMAL LOW (ref 39.0–52.0)
HCT: 24 % — ABNORMAL LOW (ref 39.0–52.0)
Hemoglobin: 13.3 g/dL (ref 13.0–17.0)
Hemoglobin: 10.2 g/dL — ABNORMAL LOW (ref 13.0–17.0)
Hemoglobin: 10.9 g/dL — ABNORMAL LOW (ref 13.0–17.0)
Hemoglobin: 8.2 g/dL — ABNORMAL LOW (ref 13.0–17.0)
O2 Saturation: 100 %
O2 Saturation: 100 %
O2 Saturation: 100 %
O2 Saturation: 100 %
Patient temperature: 36.1
Patient temperature: 33.9
Patient temperature: 35
Potassium: 4 mmol/L (ref 3.5–5.1)
Potassium: 3.3 mmol/L — ABNORMAL LOW (ref 3.5–5.1)
Potassium: 3.3 mmol/L — ABNORMAL LOW (ref 3.5–5.1)
Potassium: 3.5 mmol/L (ref 3.5–5.1)
Sodium: 140 mmol/L (ref 135–145)
Sodium: 141 mmol/L (ref 135–145)
Sodium: 142 mmol/L (ref 135–145)
Sodium: 141 mmol/L (ref 135–145)
TCO2: 28 mmol/L (ref 22–32)
TCO2: 28 mmol/L (ref 22–32)
TCO2: 28 mmol/L (ref 22–32)
TCO2: 29 mmol/L (ref 22–32)
pCO2 arterial: 45.5 mmHg (ref 32.0–48.0)
pCO2 arterial: 34.4 mmHg (ref 32.0–48.0)
pCO2 arterial: 37.9 mmHg (ref 32.0–48.0)
pCO2 arterial: 37.3 mmHg (ref 32.0–48.0)
pH, Arterial: 7.378 (ref 7.350–7.450)
pH, Arterial: 7.482 — ABNORMAL HIGH (ref 7.350–7.450)
pH, Arterial: 7.466 — ABNORMAL HIGH (ref 7.350–7.450)
pH, Arterial: 7.471 — ABNORMAL HIGH (ref 7.350–7.450)
pO2, Arterial: 462 mmHg — ABNORMAL HIGH (ref 83.0–108.0)
pO2, Arterial: 329 mmHg — ABNORMAL HIGH (ref 83.0–108.0)
pO2, Arterial: 333 mmHg — ABNORMAL HIGH (ref 83.0–108.0)
pO2, Arterial: 574 mmHg — ABNORMAL HIGH (ref 83.0–108.0)

## 2020-12-26 LAB — PREPARE PLATELET PHERESIS
Unit division: 0
Unit division: 0

## 2020-12-26 LAB — BASIC METABOLIC PANEL
Anion gap: 4 — ABNORMAL LOW (ref 5–15)
BUN: 10 mg/dL (ref 6–20)
CO2: 27 mmol/L (ref 22–32)
Calcium: 7.5 mg/dL — ABNORMAL LOW (ref 8.9–10.3)
Chloride: 110 mmol/L (ref 98–111)
Creatinine, Ser: 1.24 mg/dL (ref 0.61–1.24)
GFR, Estimated: >60 mL/min (ref >60)
Glucose, Bld: 140 mg/dL — ABNORMAL HIGH (ref 70–99)
Potassium: 3.9 mmol/L (ref 3.5–5.1)
Sodium: 141 mmol/L (ref 135–145)

## 2020-12-26 LAB — PREPARE CRYOPRECIPITATE: Unit division: 0

## 2020-12-26 LAB — CBC
HCT: 27.4 % — ABNORMAL LOW (ref 39.0–52.0)
Hemoglobin: 9.6 g/dL — ABNORMAL LOW (ref 13.0–17.0)
MCH: 31.2 pg (ref 26.0–34.0)
MCHC: 35 g/dL (ref 30.0–36.0)
MCV: 89 fL (ref 80.0–100.0)
Platelets: 153 10*3/uL (ref 150–400)
RBC: 3.08 MIL/uL — ABNORMAL LOW (ref 4.22–5.81)
RDW: 15 % (ref 11.5–15.5)
WBC: 5.5 10*3/uL (ref 4.0–10.5)
nRBC: 0 % (ref 0.0–0.2)

## 2020-12-26 LAB — BPAM CRYOPRECIPITATE
Blood Product Expiration Date: 202205202045
ISSUE DATE / TIME: 202205201507
Unit Type and Rh: 5100

## 2020-12-26 LAB — VITAMIN D 25 HYDROXY (VIT D DEFICIENCY, FRACTURES): Vit D, 25-Hydroxy: 12.98 ng/mL — ABNORMAL LOW (ref 30–100)

## 2020-12-26 LAB — POCT ACTIVATED CLOTTING TIME
Activated Clotting Time: 231 seconds
Activated Clotting Time: 130 seconds
Activated Clotting Time: 225 seconds

## 2020-12-26 LAB — HIV ANTIBODY (ROUTINE TESTING W REFLEX): HIV Screen 4th Generation wRfx: NONREACTIVE

## 2020-12-26 LAB — MRSA PCR SCREENING: MRSA by PCR: NEGATIVE

## 2020-12-26 MED ORDER — ENOXAPARIN SODIUM 30 MG/0.3ML IJ SOSY
30.0000 mg | PREFILLED_SYRINGE | Freq: Two times a day (BID) | INTRAMUSCULAR | Status: DC
  Administered 2020-12-26 – 2021-01-08 (×26): 30 mg via SUBCUTANEOUS
  Filled 2020-12-26 (×26): qty 0.3

## 2020-12-26 MED ORDER — METOCLOPRAMIDE HCL 5 MG/ML IJ SOLN
5.0000 mg | Freq: Three times a day (TID) | INTRAMUSCULAR | Status: DC | PRN
Start: 2020-12-25 — End: 2021-01-08

## 2020-12-26 MED ORDER — POLYETHYLENE GLYCOL 3350 17 G PO PACK: 17.0000 g | PACK | Freq: Every day | ORAL | Status: DC | PRN

## 2020-12-26 MED ORDER — METHOCARBAMOL 500 MG PO TABS
500.0000 mg | ORAL_TABLET | Freq: Four times a day (QID) | ORAL | Status: DC | PRN
  Administered 2020-12-26: 500 mg via ORAL
  Filled 2020-12-26 (×2): qty 1

## 2020-12-26 MED ORDER — ONDANSETRON HCL 4 MG PO TABS: 4.0000 mg | ORAL_TABLET | Freq: Four times a day (QID) | ORAL | Status: DC | PRN

## 2020-12-26 MED ORDER — OXYCODONE HCL 5 MG PO TABS
5.0000 mg | ORAL_TABLET | ORAL | Status: DC | PRN
  Administered 2020-12-26: 10 mg via ORAL
  Administered 2020-12-27: 5 mg via ORAL
  Administered 2020-12-27 – 2020-12-28 (×3): 10 mg via ORAL
  Administered 2020-12-29: 5 mg via ORAL
  Administered 2020-12-29 – 2021-01-05 (×8): 10 mg via ORAL
  Administered 2021-01-05: 5 mg via ORAL
  Administered 2021-01-06 – 2021-01-08 (×13): 10 mg via ORAL
  Filled 2020-12-26 (×3): qty 2
  Filled 2020-12-26: qty 1
  Filled 2020-12-26 (×26): qty 2

## 2020-12-26 MED ORDER — GABAPENTIN 300 MG PO CAPS: 300.0000 mg | ORAL_CAPSULE | Freq: Three times a day (TID) | ORAL | Status: DC

## 2020-12-26 MED ORDER — SODIUM CHLORIDE 0.9 % IV SOLN
2.0000 g | Freq: Every day | INTRAVENOUS | Status: AC
  Administered 2020-12-26 – 2020-12-27 (×3): 2 g via INTRAVENOUS
  Filled 2020-12-26: qty 20
  Filled 2020-12-26: qty 2
  Filled 2020-12-26: qty 20

## 2020-12-26 MED ORDER — CHLORHEXIDINE GLUCONATE CLOTH 2 % EX PADS
6.0000 | MEDICATED_PAD | Freq: Every day | CUTANEOUS | Status: DC
  Administered 2020-12-26 – 2021-01-02 (×6): 6 via TOPICAL

## 2020-12-26 MED ORDER — HYDROMORPHONE HCL 1 MG/ML IJ SOLN
0.5000 mg | INTRAMUSCULAR | Status: DC | PRN
  Administered 2020-12-26 – 2020-12-30 (×9): 1 mg via INTRAVENOUS
  Filled 2020-12-26 (×10): qty 1

## 2020-12-26 MED ORDER — METHOCARBAMOL 1000 MG/10ML IJ SOLN
500.0000 mg | Freq: Four times a day (QID) | INTRAVENOUS | Status: DC | PRN
  Filled 2020-12-26: qty 5

## 2020-12-26 MED ORDER — METOCLOPRAMIDE HCL 5 MG PO TABS: 5.0000 mg | ORAL_TABLET | Freq: Three times a day (TID) | ORAL | Status: DC | PRN

## 2020-12-26 MED ORDER — DOCUSATE SODIUM 100 MG PO CAPS
100.0000 mg | ORAL_CAPSULE | Freq: Two times a day (BID) | ORAL | Status: DC
  Administered 2020-12-26 – 2021-01-08 (×23): 100 mg via ORAL
  Filled 2020-12-26 (×26): qty 1

## 2020-12-26 MED ORDER — BACLOFEN 5 MG HALF TABLET
5.0000 mg | ORAL_TABLET | Freq: Three times a day (TID) | ORAL | Status: DC
  Filled 2020-12-26: qty 1

## 2020-12-26 MED ORDER — ONDANSETRON HCL 4 MG/2ML IJ SOLN
4.0000 mg | Freq: Four times a day (QID) | INTRAMUSCULAR | Status: DC | PRN
  Administered 2020-12-28: 4 mg via INTRAVENOUS
  Filled 2020-12-26: qty 2

## 2020-12-26 MED ORDER — PANTOPRAZOLE SODIUM 40 MG PO TBEC
40.0000 mg | DELAYED_RELEASE_TABLET | Freq: Every day | ORAL | Status: DC
  Administered 2020-12-27 – 2021-01-08 (×12): 40 mg via ORAL
  Filled 2020-12-26 (×13): qty 1

## 2020-12-26 MED ORDER — GABAPENTIN 300 MG PO CAPS
300.0000 mg | ORAL_CAPSULE | Freq: Three times a day (TID) | ORAL | Status: DC
  Administered 2020-12-26 – 2020-12-29 (×12): 300 mg via ORAL
  Filled 2020-12-26 (×12): qty 1

## 2020-12-26 MED ORDER — OXYCODONE HCL 5 MG PO TABS
10.0000 mg | ORAL_TABLET | ORAL | Status: DC | PRN
  Administered 2020-12-26 (×3): 15 mg via ORAL
  Administered 2020-12-27: 10 mg via ORAL
  Administered 2020-12-28: 15 mg via ORAL
  Filled 2020-12-26 (×4): qty 3
  Filled 2020-12-26: qty 2

## 2020-12-26 MED ORDER — PANTOPRAZOLE SODIUM 40 MG IV SOLR
40.0000 mg | Freq: Every day | INTRAVENOUS | Status: DC
  Administered 2020-12-26 – 2020-12-30 (×2): 40 mg via INTRAVENOUS
  Filled 2020-12-26 (×2): qty 40

## 2020-12-26 MED ORDER — METHOCARBAMOL 1000 MG/10ML IJ SOLN
500.0000 mg | Freq: Four times a day (QID) | INTRAVENOUS | Status: DC
  Administered 2020-12-26: 500 mg via INTRAVENOUS
  Filled 2020-12-26: qty 5

## 2020-12-26 MED ORDER — METHOCARBAMOL 500 MG PO TABS: 500.0000 mg | ORAL_TABLET | Freq: Four times a day (QID) | ORAL | Status: DC

## 2020-12-26 MED ORDER — ACETAMINOPHEN 500 MG PO TABS
1000.0000 mg | ORAL_TABLET | Freq: Four times a day (QID) | ORAL | Status: DC
  Administered 2020-12-26 – 2020-12-31 (×17): 1000 mg via ORAL
  Filled 2020-12-26 (×20): qty 2
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
## 2020-12-26 NOTE — Progress Notes (Signed)
Inpatient Rehab Admissions Coordinator Note:   Per PT/OT recommendations, pt was screened for CIR candidacy by Wolfgang Phoenix, MS, CCC-SLP.  At this time we are recommending an inpatient rehab consult. AC will place consult order per protocol.  Please contact me with questions.    Wolfgang Phoenix, MS, CCC-SLP Admissions Coordinator [phone] 12/26/20 4:22 PM
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
## 2020-12-26 NOTE — Anesthesia Postprocedure Evaluation (Signed)
Anesthesia Post Note  Patient: Robert Good  Procedure(s) Performed: REPAIR RIGHT SUBCLAVIAN ARTERY TRANSECTION  WITH RIGHT SUBCLAVIAN ARTERY TO RIGHT BRACHIAL ARTERY BYPASS USING RIGHT GREATER SAPHENOUS VEIN (Right Chest) HARVEST OF RIGHT GREATER SAPHENOUS VEIN (Right Leg Upper) ANGIOGRAM OF RIGHT BRACHIAL ARTERY AND INNOMINATE ARTERY (Right Chest)     Patient location during evaluation: PACU Anesthesia Type: General Level of consciousness: awake and alert Pain management: pain level controlled Vital Signs Assessment: post-procedure vital signs reviewed and stable Respiratory status: spontaneous breathing, nonlabored ventilation, respiratory function stable and patient connected to nasal cannula oxygen Cardiovascular status: blood pressure returned to baseline and stable Postop Assessment: no apparent nausea or vomiting Anesthetic complications: no   No complications documented.  Last Vitals:  Vitals:   12/25/20 2345 12/26/20 0000  BP: 114/83   Pulse: 65 74  Resp: 17 13  Temp:    SpO2: 100% 100%    Last Pain:  Vitals:   12/25/20 2330  TempSrc:   PainSc: 10-Worst pain ever    LLE Motor Response: Purposeful movement;Responds to commands (12/26/20 0000) LLE Sensation: Full sensation (12/26/20 0000) RLE Motor Response: Purposeful movement;Responds to commands (12/26/20 0000) RLE Sensation: Full sensation (12/26/20 0000)      [name],W. EDMOND

## 2020-12-26 NOTE — Plan of Care (Signed)
Patient understands foley care and how it will help keep him infection free. Communicates pain needs frequently so that pain management is priority.

## 2020-12-26 NOTE — Progress Notes (Signed)
PT Cancellation Note  Patient Details Name: Robert Good MRN: [id number] DOB: 04-08-1991   Cancelled Treatment:    Reason Eval/Treat Not Completed: Active bedrest order. Coordinated with RN to notify PT if bedrest orders are discharged and pt becomes appropriate for therapy later this date. Will follow-up as time permits and as appropriate.   Raymond Gurney, PT, DPT Acute Rehabilitation Services  Pager: [phone] Office: [phone]    Jewel Baize 12/26/2020, 9:02 AM

## 2020-12-26 NOTE — Progress Notes (Signed)
Patient unable to move RUE and has no sensation.  Dr. Edilia Bo of vascular surgery paged and made aware.  Dr. Edilia Bo stated this was to be expected due to the severe damage to the brachial plexus.  No new orders received.  Will continue to monitor.

## 2020-12-26 NOTE — Progress Notes (Signed)
Orthopaedic Trauma Progress Note  SUBJECTIVE: Doing okay this morning.  Notes he is sore all over but overall pain is controlled.  Major complaint is tingling throughout the right arm.  No chest pain. No SOB. No nausea/vomiting. No other complaints this morning.  No family at bedside currently.  Nursing asking about changing from Robaxin to baclofen for nerve injury to right upper extremity.  We will leave this to the discretion of the trauma team if they feel this change is appropriate.  OBJECTIVE:  Vitals:   12/26/20 0700 12/26/20 0800  BP:  111/60  Pulse: 72 73  Resp: 15 17  Temp:  98.9 F (37.2 C)  SpO2: 100% 99%    General: Laying in bed, no acute distress.  C-collar in place Respiratory: No increased work of breathing.  Right upper extremity: Dressings over extremity are clean, dry, intact.  Does note sensation to light touch over the axilla but otherwise endorses no sensation to light touch of the hand or throughout the arm.  Unable to wiggle fingers.  Hand warm and well-perfused  Left lower extremity: Dressings clean, dry, intact.  Incisional VAC with good seal and function, no output in canister currently.  Tender throughout the leg.  Tolerates gentle ankle dorsiflexion/plantarflexion.  Able to wiggle toes.  Fullness to the thigh as expected but compartments are compressible.  Foot warm and well-perfused.+ DP pulse  IMAGING: Stable post op imaging.   LABS:  Results for orders placed or performed during the hospital encounter of 12/25/20 (from the past 24 hour(s))  Resp Panel by RT-PCR (Flu A&B, Covid) Nasopharyngeal Swab     Status: None   Collection Time: 12/25/20 11:47 AM   Specimen: Nasopharyngeal Swab; Nasopharyngeal(NP) swabs in vial transport medium  Result Value Ref Range   SARS Coronavirus 2 by RT PCR NEGATIVE NEGATIVE   Influenza A by PCR NEGATIVE NEGATIVE   Influenza B by PCR NEGATIVE NEGATIVE  Urinalysis, Routine w reflex microscopic     Status: Abnormal    Collection Time: 12/25/20 11:47 AM  Result Value Ref Range   Color, Urine STRAW (A) YELLOW   APPearance CLEAR CLEAR   Specific Gravity, Urine 1.024 1.005 - 1.030   pH 6.0 5.0 - 8.0   Glucose, UA NEGATIVE NEGATIVE mg/dL   Hgb urine dipstick MODERATE (A) NEGATIVE   Bilirubin Urine NEGATIVE NEGATIVE   Ketones, ur NEGATIVE NEGATIVE mg/dL   Protein, ur 30 (A) NEGATIVE mg/dL   Nitrite NEGATIVE NEGATIVE   Leukocytes,Ua NEGATIVE NEGATIVE   RBC / HPF 0-5 0 - 5 RBC/hpf   WBC, UA 0-5 0 - 5 WBC/hpf   Bacteria, UA NONE SEEN NONE SEEN   Mucus PRESENT   Prepare fresh frozen plasma     Status: None (Preliminary result)   Collection Time: 12/25/20 11:51 AM  Result Value Ref Range   Unit Number [id number]    Blood Component Type THAWED PLASMA    Unit division 00    Status of Unit REL FROM [facility]    Transfusion Status OK TO TRANSFUSE    Unit Number [id number]    Blood Component Type THW PLS APHR    Unit division B0    Status of Unit ALLOCATED    Transfusion Status OK TO TRANSFUSE    Unit Number [id number]    Blood Component Type LIQ PLASMA    Unit division 00    Status of Unit ISSUED    Transfusion Status OK TO TRANSFUSE    Unit Number [id number]  Blood Component Type LIQ PLASMA    Unit division 00    Status of Unit REL FROM [facility]    Unit tag comment EMERGENCY RELEASE    Transfusion Status OK TO TRANSFUSE    Unit Number [id number]    Blood Component Type LIQ PLASMA    Unit division 00    Status of Unit REL FROM [facility]    Unit tag comment EMERGENCY RELEASE    Transfusion Status OK TO TRANSFUSE    Unit Number [id number]    Blood Component Type LIQ PLASMA    Unit division 00    Status of Unit REL FROM [facility]    Unit tag comment EMERGENCY RELEASE    Transfusion Status OK TO TRANSFUSE    Unit Number [id number]    Blood Component Type LIQ PLASMA    Unit division 00    Status of Unit REL FROM [facility]    Unit tag comment EMERGENCY RELEASE    Transfusion Status OK TO  TRANSFUSE    Unit Number [id number]    Blood Component Type THW PLS APHR    Unit division A0    Status of Unit REL FROM [facility]    Transfusion Status      OK TO TRANSFUSE Performed at [facility], [address], [place], [place] [address]    Unit Number [id number]    Blood Component Type THW PLS APHR    Unit division A0    Status of Unit REL FROM [facility]    Transfusion Status OK TO TRANSFUSE    Unit Number [id number]    Blood Component Type THAWED PLASMA    Unit division 00    Status of Unit ALLOCATED    Transfusion Status OK TO TRANSFUSE    Unit Number [id number]    Blood Component Type THW PLS APHR    Unit division A0    Status of Unit ALLOCATED    Transfusion Status OK TO TRANSFUSE    Unit Number [id number]    Blood Component Type THW PLS APHR    Unit division 00    Status of Unit REL FROM [facility]    Transfusion Status OK TO TRANSFUSE    Unit Number [id number]    Blood Component Type THAWED PLASMA    Unit division 00    Status of Unit ALLOCATED    Transfusion Status OK TO TRANSFUSE    Unit Number [id number]    Blood Component Type THAWED PLASMA    Unit division 00    Status of Unit REL FROM [facility]    Transfusion Status OK TO TRANSFUSE   Lactic acid, plasma     Status: Abnormal   Collection Time: 12/25/20 11:55 AM  Result Value Ref Range   Lactic Acid, Venous 2.5 (HH) 0.5 - 1.9 mmol/L  Comprehensive metabolic panel     Status: Abnormal   Collection Time: 12/25/20 11:56 AM  Result Value Ref Range   Sodium 137 135 - 145 mmol/L   Potassium 3.7 3.5 - 5.1 mmol/L   Chloride 107 98 - 111 mmol/L   CO2 22 22 - 32 mmol/L   Glucose, Bld 157 (H) 70 - 99 mg/dL   BUN 10 6 - 20 mg/dL   Creatinine, Ser 8.30 (H) 0.61 - 1.24 mg/dL   Calcium 9.0 8.9 - 94.0 mg/dL   Total Protein 6.6 6.5 - 8.1 g/dL   Albumin 3.9 3.5 - 5.0 g/dL   AST 27 15 - 41 U/L   ALT 18  0 - 44 U/L   Alkaline Phosphatase 48 38 - 126 U/L   Total Bilirubin 0.7 0.3 - 1.2 mg/dL   GFR,  Estimated >70 >26 mL/min   Anion gap 8 5 - 15  CBC     Status: None   Collection Time: 12/25/20 11:56 AM  Result Value Ref Range   WBC 6.6 4.0 - 10.5 K/uL   RBC 4.55 4.22 - 5.81 MIL/uL   Hemoglobin 14.6 13.0 - 17.0 g/dL   HCT 37.8 58.8 - 50.2 %   MCV 97.1 80.0 - 100.0 fL   MCH 32.1 26.0 - 34.0 pg   MCHC 33.0 30.0 - 36.0 g/dL   RDW 77.4 12.8 - 78.6 %   Platelets 266 150 - 400 K/uL   nRBC 0.0 0.0 - 0.2 %  Ethanol     Status: None   Collection Time: 12/25/20 11:56 AM  Result Value Ref Range   Alcohol, Ethyl (B) <10 <10 mg/dL  Protime-INR     Status: None   Collection Time: 12/25/20 11:56 AM  Result Value Ref Range   Prothrombin Time 14.0 11.4 - 15.2 seconds   INR 1.1 0.8 - 1.2  Type and screen Ordered by PROVIDER DEFAULT     Status: None (Preliminary result)   Collection Time: 12/25/20 11:58 AM  Result Value Ref Range   ABO/RH(D) O POS    Antibody Screen NEG    Sample Expiration 12/28/2020,2359    Unit Number [id number]    Blood Component Type RED CELLS,LR    Unit division 00    Status of Unit ISSUED    Unit tag comment EMERGENCY RELEASE    Transfusion Status OK TO TRANSFUSE    Crossmatch Result COMPATIBLE    Unit Number [id number]    Blood Component Type RED CELLS,LR    Unit division 00    Status of Unit REL FROM [facility]    Unit tag comment EMERGENCY RELEASE    Transfusion Status OK TO TRANSFUSE    Crossmatch Result COMPATIBLE    Unit Number [id number]    Blood Component Type RED CELLS,LR    Unit division 00    Status of Unit ISSUED    Transfusion Status OK TO TRANSFUSE    Crossmatch Result COMPATIBLE    Unit Number [id number]    Blood Component Type RED CELLS,LR    Unit division 00    Status of Unit REL FROM [facility]    Unit tag comment EMERGENCY RELEASE    Transfusion Status OK TO TRANSFUSE    Crossmatch Result NOT NEEDED    Unit Number [id number]    Blood Component Type RED CELLS,LR    Unit division 00    Status of Unit REL FROM [facility]    Unit  tag comment EMERGENCY RELEASE    Transfusion Status OK TO TRANSFUSE    Crossmatch Result NOT NEEDED    Unit Number [id number]    Blood Component Type RED CELLS,LR    Unit division 00    Status of Unit REL FROM [facility]    Unit tag comment EMERGENCY RELEASE    Transfusion Status OK TO TRANSFUSE    Crossmatch Result NOT NEEDED    Unit Number [id number]    Blood Component Type RED CELLS,LR    Unit division 00    Status of Unit REL FROM [facility]    Unit tag comment EMERGENCY RELEASE    Transfusion Status OK TO TRANSFUSE    Crossmatch Result NOT NEEDED  Unit Number [id number]    Blood Component Type RED CELLS,LR    Unit division 00    Status of Unit ALLOCATED    Transfusion Status OK TO TRANSFUSE    Crossmatch Result      Compatible Performed at [facility], [address] [place], [place] [address]    Unit Number [id number]    Blood Component Type RED CELLS,LR    Unit division 00    Status of Unit ISSUED    Transfusion Status OK TO TRANSFUSE    Crossmatch Result Compatible    Unit Number [id number]    Blood Component Type RED CELLS,LR    Unit division 00    Status of Unit ISSUED    Transfusion Status OK TO TRANSFUSE    Crossmatch Result Compatible    Unit Number [id number]    Blood Component Type RED CELLS,LR    Unit division 00    Status of Unit ALLOCATED    Transfusion Status OK TO TRANSFUSE    Crossmatch Result Compatible   I-Stat Chem 8, ED     Status: Abnormal   Collection Time: 12/25/20 12:04 PM  Result Value Ref Range   Sodium 141 135 - 145 mmol/L   Potassium 3.7 3.5 - 5.1 mmol/L   Chloride 105 98 - 111 mmol/L   BUN 11 6 - 20 mg/dL   Creatinine, Ser 6.44 (H) 0.61 - 1.24 mg/dL   Glucose, Bld 034 (H) 70 - 99 mg/dL   Calcium, Ion 7.42 1.15 - 1.40 mmol/L   TCO2 25 22 - 32 mmol/L   Hemoglobin 14.6 13.0 - 17.0 g/dL   HCT 59.5 63.8 - 75.6 %  ABO/Rh     Status: None   Collection Time: 12/25/20 12:14 PM  Result Value Ref Range   ABO/RH(D)       O POS Performed at [facility], [address] [place], [place] [address]   I-STAT 7, (LYTES, BLD GAS, ICA, H+H)     Status: Abnormal   Collection Time: 12/25/20  1:55 PM  Result Value Ref Range   pH, Arterial 7.378 7.350 - 7.450   pCO2 arterial 45.5 32.0 - 48.0 mmHg   pO2, Arterial 462 (H) 83.0 - 108.0 mmHg   Bicarbonate 27.1 20.0 - 28.0 mmol/L   TCO2 28 22 - 32 mmol/L   O2 Saturation 100.0 %   Acid-Base Excess 1.0 0.0 - 2.0 mmol/L   Sodium 140 135 - 145 mmol/L   Potassium 4.0 3.5 - 5.1 mmol/L   Calcium, Ion 1.20 1.15 - 1.40 mmol/L   HCT 39.0 39.0 - 52.0 %   Hemoglobin 13.3 13.0 - 17.0 g/dL   Patient temperature 51.8 C    Sample type ARTERIAL   Prepare platelet pheresis     Status: None (Preliminary result)   Collection Time: 12/25/20  2:53 PM  Result Value Ref Range   Unit Number [id number]    Blood Component Type PLTP2 PSORALEN TREATED    Unit division 00    Status of Unit ISSUED    Transfusion Status      OK TO TRANSFUSE Performed at [facility], [address] [place], [place] [address]    Unit Number [id number]    Blood Component Type PLTP2 PSORALEN TREATED    Unit division 00    Status of Unit ISSUED    Transfusion Status OK TO TRANSFUSE   Prepare cryoprecipitate     Status: None (Preliminary result)  Collection Time: 12/25/20  2:56 PM  Result Value Ref Range   Unit Number [id number]    Blood Component Type CRYPOOL THAW    Unit division 00    Status of Unit ISSUED    Transfusion Status      OK TO TRANSFUSE Performed at [facility], [address] [place], [place] [address]   I-STAT 7, (LYTES, BLD GAS, ICA, H+H)     Status: Abnormal   Collection Time: 12/25/20  3:01 PM  Result Value Ref Range   pH, Arterial 7.482 (H) 7.350 - 7.450   pCO2 arterial 34.4 32.0 - 48.0 mmHg   pO2, Arterial 329 (H) 83.0 - 108.0 mmHg   Bicarbonate 26.5 20.0 - 28.0 mmol/L   TCO2 28 22 - 32 mmol/L   O2 Saturation 100.0 %   Acid-Base Excess 2.0  0.0 - 2.0 mmol/L   Sodium 141 135 - 145 mmol/L   Potassium 3.3 (L) 3.5 - 5.1 mmol/L   Calcium, Ion 1.16 1.15 - 1.40 mmol/L   HCT 30.0 (L) 39.0 - 52.0 %   Hemoglobin 10.2 (L) 13.0 - 17.0 g/dL   Patient temperature 78.233.9 C    Sample type ARTERIAL   POCT Activated clotting time     Status: None   Collection Time: 12/25/20  5:13 PM  Result Value Ref Range   Activated Clotting Time 231 seconds  I-STAT 7, (LYTES, BLD GAS, ICA, H+H)     Status: Abnormal   Collection Time: 12/25/20  5:26 PM  Result Value Ref Range   pH, Arterial 7.466 (H) 7.350 - 7.450   pCO2 arterial 37.9 32.0 - 48.0 mmHg   pO2, Arterial 333 (H) 83.0 - 108.0 mmHg   Bicarbonate 27.3 20.0 - 28.0 mmol/L   TCO2 28 22 - 32 mmol/L   O2 Saturation 100.0 %   Acid-Base Excess 3.0 (H) 0.0 - 2.0 mmol/L   Sodium 142 135 - 145 mmol/L   Potassium 3.3 (L) 3.5 - 5.1 mmol/L   Calcium, Ion 1.09 (L) 1.15 - 1.40 mmol/L   HCT 32.0 (L) 39.0 - 52.0 %   Hemoglobin 10.9 (L) 13.0 - 17.0 g/dL   Collection site Radial    Drawn by Nurse    Sample type ARTERIAL   POCT Activated clotting time     Status: None   Collection Time: 12/25/20  6:09 PM  Result Value Ref Range   Activated Clotting Time 225 seconds  POCT Activated clotting time     Status: None   Collection Time: 12/25/20  6:28 PM  Result Value Ref Range   Activated Clotting Time 130 seconds  Lactic acid, plasma     Status: Abnormal   Collection Time: 12/25/20  7:56 PM  Result Value Ref Range   Lactic Acid, Venous 3.7 (HH) 0.5 - 1.9 mmol/L  I-STAT 7, (LYTES, BLD GAS, ICA, H+H)     Status: Abnormal   Collection Time: 12/25/20  9:56 PM  Result Value Ref Range   pH, Arterial 7.471 (H) 7.350 - 7.450   pCO2 arterial 37.3 32.0 - 48.0 mmHg   pO2, Arterial 574 (H) 83.0 - 108.0 mmHg   Bicarbonate 27.7 20.0 - 28.0 mmol/L   TCO2 29 22 - 32 mmol/L   O2 Saturation 100.0 %   Acid-Base Excess 3.0 (H) 0.0 - 2.0 mmol/L   Sodium 141 135 - 145 mmol/L   Potassium 3.5 3.5 - 5.1 mmol/L   Calcium,  Ion 1.05 (L) 1.15 - 1.40 mmol/L  HCT 24.0 (L) 39.0 - 52.0 %   Hemoglobin 8.2 (L) 13.0 - 17.0 g/dL   Patient temperature 27.0 C    Sample type ARTERIAL   MRSA PCR Screening     Status: None   Collection Time: 12/26/20 12:51 AM   Specimen: Nasal Mucosa; Nasopharyngeal  Result Value Ref Range   MRSA by PCR NEGATIVE NEGATIVE  CBC     Status: Abnormal   Collection Time: 12/26/20  3:27 AM  Result Value Ref Range   WBC 5.5 4.0 - 10.5 K/uL   RBC 3.08 (L) 4.22 - 5.81 MIL/uL   Hemoglobin 9.6 (L) 13.0 - 17.0 g/dL   HCT 62.3 (L) 76.2 - 83.1 %   MCV 89.0 80.0 - 100.0 fL   MCH 31.2 26.0 - 34.0 pg   MCHC 35.0 30.0 - 36.0 g/dL   RDW 51.7 61.6 - 07.3 %   Platelets 153 150 - 400 K/uL   nRBC 0.0 0.0 - 0.2 %  Basic metabolic panel     Status: Abnormal   Collection Time: 12/26/20  3:27 AM  Result Value Ref Range   Sodium 141 135 - 145 mmol/L   Potassium 3.9 3.5 - 5.1 mmol/L   Chloride 110 98 - 111 mmol/L   CO2 27 22 - 32 mmol/L   Glucose, Bld 140 (H) 70 - 99 mg/dL   BUN 10 6 - 20 mg/dL   Creatinine, Ser 7.10 0.61 - 1.24 mg/dL   Calcium 7.5 (L) 8.9 - 10.3 mg/dL   GFR, Estimated >62 >69 mL/min   Anion gap 4 (L) 5 - 15    ASSESSMENT: Robert Good is a 30 y.o. male, 1 Day Post-Op s/p 1. Irrigation debridement left tibia with intramedullary nail  2.  Irrigation debridement left femur with retrograde intramedullary nail  3.  Irrigation debridement and closure of lacerations to right hand and forearm   CV/Blood loss: Acute blood loss anemia, Hgb 9.6 this morning. Hemodynamically stable  PLAN: Weightbearing: TDWB LLE, WBAT RUE ROM: Okay for unrestricted range of motion left knee and ankle.  Unrestricted ROM RUE Incisional and dressing care: Reinforce dressings as needed. PLEASE DO NOT REMOVE ANY SUTURES UNLESS DISCUSSED WITH ORTHO TEAM  Orthopedic device(s): Wound Vac:LLE  Pain management: Per trauma VTE prophylaxis: Lovenox, SCDs ID: Ceftriaxone postop for open fracture prophylaxis   Foley/Lines: Foley in place.  Continue IV fluids per trauma team Impediments to Fracture Healing: Polytrauma.  Vitamin D level pending, will start supplementation as indicated Dispo: PT/OT evaluation today.  Plan to remove incisional wound VAC from LLE sometime next week.  Follow - up plan: We will continue to follow patient while hospitalized.  Plan for outpatient follow-up 2 weeks after discharge for repeat x-rays  Contact information:  Truitt Merle MD, Ulyses Southward PA-C. After hours and holidays please check [email or web address] for group call information for [facility]   Sharlette Jansma A. Michaelyn Barter, PA-C [phone] (office) [email or web address]

## 2020-12-27 ENCOUNTER — Inpatient Hospital Stay (HOSPITAL_COMMUNITY): Payer: Non-veteran care

## 2020-12-27 LAB — CBC
HCT: 22.2 % — ABNORMAL LOW (ref 39.0–52.0)
HCT: 21 % — ABNORMAL LOW (ref 39.0–52.0)
HCT: 26.6 % — ABNORMAL LOW (ref 39.0–52.0)
Hemoglobin: 7.6 g/dL — ABNORMAL LOW (ref 13.0–17.0)
Hemoglobin: 7.1 g/dL — ABNORMAL LOW (ref 13.0–17.0)
Hemoglobin: 9.2 g/dL — ABNORMAL LOW (ref 13.0–17.0)
MCH: 31.5 pg (ref 26.0–34.0)
MCH: 31 pg (ref 26.0–34.0)
MCH: 31.1 pg (ref 26.0–34.0)
MCHC: 34.2 g/dL (ref 30.0–36.0)
MCHC: 33.8 g/dL (ref 30.0–36.0)
MCHC: 34.6 g/dL (ref 30.0–36.0)
MCV: 92.1 fL (ref 80.0–100.0)
MCV: 91.7 fL (ref 80.0–100.0)
MCV: 89.9 fL (ref 80.0–100.0)
Platelets: 121 10*3/uL — ABNORMAL LOW (ref 150–400)
Platelets: 120 10*3/uL — ABNORMAL LOW (ref 150–400)
Platelets: 122 10*3/uL — ABNORMAL LOW (ref 150–400)
RBC: 2.41 MIL/uL — ABNORMAL LOW (ref 4.22–5.81)
RBC: 2.29 MIL/uL — ABNORMAL LOW (ref 4.22–5.81)
RBC: 2.96 MIL/uL — ABNORMAL LOW (ref 4.22–5.81)
RDW: 14.3 % (ref 11.5–15.5)
RDW: 14.1 % (ref 11.5–15.5)
RDW: 13.8 % (ref 11.5–15.5)
WBC: 6 10*3/uL (ref 4.0–10.5)
WBC: 6.4 10*3/uL (ref 4.0–10.5)
WBC: 6.5 10*3/uL (ref 4.0–10.5)
nRBC: 0 % (ref 0.0–0.2)
nRBC: 0 % (ref 0.0–0.2)
nRBC: 0 % (ref 0.0–0.2)

## 2020-12-27 LAB — BASIC METABOLIC PANEL
Anion gap: 4 — ABNORMAL LOW (ref 5–15)
BUN: 6 mg/dL (ref 6–20)
CO2: 28 mmol/L (ref 22–32)
Calcium: 7.3 mg/dL — ABNORMAL LOW (ref 8.9–10.3)
Chloride: 104 mmol/L (ref 98–111)
Creatinine, Ser: 1.1 mg/dL (ref 0.61–1.24)
GFR, Estimated: >60 mL/min (ref >60)
Glucose, Bld: 110 mg/dL — ABNORMAL HIGH (ref 70–99)
Potassium: 3.2 mmol/L — ABNORMAL LOW (ref 3.5–5.1)
Sodium: 136 mmol/L (ref 135–145)

## 2020-12-27 LAB — PREPARE RBC (CROSSMATCH)

## 2020-12-27 MED ORDER — SODIUM CHLORIDE 0.9% IV SOLUTION: Freq: Once | INTRAVENOUS | Status: AC

## 2020-12-27 MED ORDER — POTASSIUM CHLORIDE CRYS ER 20 MEQ PO TBCR
20.0000 meq | EXTENDED_RELEASE_TABLET | Freq: Two times a day (BID) | ORAL | Status: DC
  Administered 2020-12-27 – 2021-01-08 (×26): 20 meq via ORAL
  Filled 2020-12-27 (×26): qty 1

## 2020-12-27 NOTE — Progress Notes (Signed)
Subjective: 2 Days Post-Op Procedure(s) (LRB): INTRAMEDULLARY (IM) NAIL TIBIAL (Left) INTRAMEDULLARY (IM) NAIL FEMORAL (Left) IRRIGATION AND DEBRIDEMENT LEFT TIB/FIB, FEMUR (Left) IRRIGATION AND DEBRIDEMENT AND CLOSURE OF LACERATIONS OF  HAND AND ARM (Right)  Patient reports pain as moderate.    Objective:   VITALS:  Temp:  [98.3 F (36.8 C)-98.9 F (37.2 C)] 98.9 F (37.2 C) (05/22 1600) Pulse Rate:  [63-98] 76 (05/22 1500) Resp:  [13-27] 20 (05/22 1500) BP: (102-124)/(55-78) 117/73 (05/22 1400) SpO2:  [87 %-100 %] 92 % (05/22 1500) Arterial Line BP: (93-162)/(50-82) 162/74 (05/22 1500)  Dorsiflexion/Plantar flexion intact Incision: dressing C/D/I and VAC in place   LABS Recent Labs    12/25/20 2156 12/26/20 0327 12/27/20 0500 12/27/20 0831 12/27/20 1459  HGB 8.2* 9.6* 7.6* 7.1* 9.2*  WBC  --  5.5 6.0 6.4 6.5  PLT  --  153 121* 120* 122*   Recent Labs    12/26/20 0327 12/27/20 0500  NA 141 136  K 3.9 3.2*  CL 110 104  CO2 27 28  BUN 10 6  CREATININE 1.24 1.10  GLUCOSE 140* 110*   Recent Labs    12/25/20 1156  INR 1.1     Assessment/Plan: 2 Days Post-Op Procedure(s) (LRB): INTRAMEDULLARY (IM) NAIL TIBIAL (Left) INTRAMEDULLARY (IM) NAIL FEMORAL (Left) IRRIGATION AND DEBRIDEMENT LEFT TIB/FIB, FEMUR (Left) IRRIGATION AND DEBRIDEMENT AND CLOSURE OF LACERATIONS OF  HAND AND ARM (Right)  Plan for CIR placement is in progress Plan for [facility] change tomorrow  BLAIR Argelia Formisano 12/27/2020, 4:52 PM

## 2020-12-27 NOTE — Progress Notes (Signed)
Inpatient Rehab Admissions:  Inpatient Rehab Consult received.  I met with patient at the bedside for rehabilitation assessment and to discuss goals and expectations of an inpatient rehab admission.  Pt's mother, sister, and brother also present.  Pt/family acknowledged understanding of goals and expectations. Pt interested in pursuing CIR.  Pt gave permission to contact significant other, [name].  Spoke with her on the telephone regarding CIR goals and expectations. She acknowledged understanding.  She is supportive of pt pursuing  CIR.  Pt is listed as uninsured. Pt told AC he has [employer].  [name] also said the same.  She told AC she would call the [place] and try to obtain his subscriber number and give that information to [facility] if she is able to do so.  Will continue to follow.  Signed: Gayland Curry, [place], [place] Admissions Coordinator [phone]
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
## 2020-12-27 NOTE — Progress Notes (Signed)
Flex-ex / open mouth views reviewed, no evidence of pathologic motion. Collar removed and cleared w/ NEXUS criteria, can d/c cervical collar.
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
## 2020-12-28 ENCOUNTER — Encounter (HOSPITAL_COMMUNITY): Payer: Self-pay | Admitting: Vascular Surgery

## 2020-12-28 LAB — BASIC METABOLIC PANEL
Anion gap: 4 — ABNORMAL LOW (ref 5–15)
BUN: 6 mg/dL (ref 6–20)
CO2: 30 mmol/L (ref 22–32)
Calcium: 8 mg/dL — ABNORMAL LOW (ref 8.9–10.3)
Chloride: 103 mmol/L (ref 98–111)
Creatinine, Ser: 1.02 mg/dL (ref 0.61–1.24)
GFR, Estimated: >60 mL/min (ref >60)
Glucose, Bld: 114 mg/dL — ABNORMAL HIGH (ref 70–99)
Potassium: 3.6 mmol/L (ref 3.5–5.1)
Sodium: 137 mmol/L (ref 135–145)

## 2020-12-28 LAB — CBC
HCT: 25 % — ABNORMAL LOW (ref 39.0–52.0)
Hemoglobin: 8.7 g/dL — ABNORMAL LOW (ref 13.0–17.0)
MCH: 31.1 pg (ref 26.0–34.0)
MCHC: 34.8 g/dL (ref 30.0–36.0)
MCV: 89.3 fL (ref 80.0–100.0)
Platelets: 121 10*3/uL — ABNORMAL LOW (ref 150–400)
RBC: 2.8 MIL/uL — ABNORMAL LOW (ref 4.22–5.81)
RDW: 13.7 % (ref 11.5–15.5)
WBC: 6.1 10*3/uL (ref 4.0–10.5)
nRBC: 0 % (ref 0.0–0.2)

## 2020-12-28 MED ORDER — ASPIRIN EC 81 MG PO TBEC
81.0000 mg | DELAYED_RELEASE_TABLET | Freq: Every day | ORAL | Status: DC
  Administered 2020-12-28 – 2021-01-08 (×12): 81 mg via ORAL
  Filled 2020-12-28 (×12): qty 1

## 2020-12-28 MED ORDER — VITAMIN D (ERGOCALCIFEROL) 1.25 MG (50000 UNIT) PO CAPS
50000.0000 [IU] | ORAL_CAPSULE | ORAL | Status: DC
  Administered 2020-12-28 – 2021-01-04 (×2): 50000 [IU] via ORAL
  Filled 2020-12-28 (×2): qty 1
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
## 2020-12-28 NOTE — Progress Notes (Signed)
Orthopedic Tech Progress Note Patient Details:  Robert Good 22-Mar-1991 [id number] Dropped off CAM WALKER BOOT to patient room per PA Ortho Devices Type of Ortho Device: CAM walker Ortho Device/Splint Location: LLE Ortho Device/Splint Interventions: Ordered   Post Interventions Patient Tolerated: Well Instructions Provided: Care of device   [name] 12/28/2020, 9:05 AM
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
## 2020-12-28 NOTE — Addendum Note (Signed)
Addendum  created 12/28/20 3536 by Rachel Moulds, CRNA   Order list changed
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
## 2020-12-29 LAB — PREPARE FRESH FROZEN PLASMA
Unit division: 0
Unit division: 0
Unit division: 0
Unit division: 0
Unit division: 0
Unit division: 0
Unit division: 0
Unit division: 0
Unit division: 0
Unit division: 0

## 2020-12-29 LAB — CBC
HCT: 27.7 % — ABNORMAL LOW (ref 39.0–52.0)
Hemoglobin: 9.7 g/dL — ABNORMAL LOW (ref 13.0–17.0)
MCH: 31.5 pg (ref 26.0–34.0)
MCHC: 35 g/dL (ref 30.0–36.0)
MCV: 89.9 fL (ref 80.0–100.0)
Platelets: 141 10*3/uL — ABNORMAL LOW (ref 150–400)
RBC: 3.08 MIL/uL — ABNORMAL LOW (ref 4.22–5.81)
RDW: 13.1 % (ref 11.5–15.5)
WBC: 6.2 10*3/uL (ref 4.0–10.5)
nRBC: 0 % (ref 0.0–0.2)

## 2020-12-29 LAB — BPAM FFP
Blood Product Expiration Date: 202205212359
Blood Product Expiration Date: 202205232359
Blood Product Expiration Date: 202205232359
Blood Product Expiration Date: 202205252359
Blood Product Expiration Date: 202205252359
Blood Product Expiration Date: 202205252359
Blood Product Expiration Date: 202205252359
Blood Product Expiration Date: 202205252359
Blood Product Expiration Date: 202205252359
Blood Product Expiration Date: 202205252359
Blood Product Expiration Date: 202205302359
Blood Product Expiration Date: 202205312359
Blood Product Expiration Date: 202205312359
Blood Product Expiration Date: 202205312359
ISSUE DATE / TIME: 202205201148
ISSUE DATE / TIME: 202205201346
ISSUE DATE / TIME: 202205201739
ISSUE DATE / TIME: 202205221824
ISSUE DATE / TIME: 202205222146
ISSUE DATE / TIME: 202205222208
ISSUE DATE / TIME: 202205222354
ISSUE DATE / TIME: 202205231249
ISSUE DATE / TIME: 202205240323
ISSUE DATE / TIME: 202205240323
ISSUE DATE / TIME: 202205241005
ISSUE DATE / TIME: 202205241005
ISSUE DATE / TIME: 202205241005
Unit Type and Rh: 6200
Unit Type and Rh: 6200
Unit Type and Rh: 6200
Unit Type and Rh: 6200
Unit Type and Rh: 6200
Unit Type and Rh: 6200
Unit Type and Rh: 6200
Unit Type and Rh: 6200
Unit Type and Rh: 6200
Unit Type and Rh: 6200
Unit Type and Rh: 6200
Unit Type and Rh: 6200
Unit Type and Rh: 6200
Unit Type and Rh: 6200

## 2020-12-29 LAB — BASIC METABOLIC PANEL
Anion gap: 9 (ref 5–15)
BUN: 7 mg/dL (ref 6–20)
CO2: 29 mmol/L (ref 22–32)
Calcium: 8.6 mg/dL — ABNORMAL LOW (ref 8.9–10.3)
Chloride: 100 mmol/L (ref 98–111)
Creatinine, Ser: 1.04 mg/dL (ref 0.61–1.24)
GFR, Estimated: >60 mL/min (ref >60)
Glucose, Bld: 104 mg/dL — ABNORMAL HIGH (ref 70–99)
Potassium: 3.5 mmol/L (ref 3.5–5.1)
Sodium: 138 mmol/L (ref 135–145)

## 2020-12-29 LAB — BLOOD PRODUCT ORDER (VERBAL) VERIFICATION

## 2020-12-29 MED ORDER — MECLIZINE HCL 12.5 MG PO TABS
12.5000 mg | ORAL_TABLET | Freq: Three times a day (TID) | ORAL | Status: DC | PRN
  Filled 2020-12-29 (×2): qty 1

## 2020-12-29 MED ORDER — METHOCARBAMOL 500 MG PO TABS
500.0000 mg | ORAL_TABLET | Freq: Three times a day (TID) | ORAL | Status: DC
  Administered 2020-12-29 (×3): 500 mg via ORAL
  Filled 2020-12-29 (×3): qty 1

## 2020-12-29 MED ORDER — POLYETHYLENE GLYCOL 3350 17 G PO PACK
17.0000 g | PACK | Freq: Every day | ORAL | Status: DC
  Administered 2020-12-29 – 2021-01-03 (×5): 17 g via ORAL
  Filled 2020-12-29 (×9): qty 1

## 2020-12-29 NOTE — Progress Notes (Signed)
Inpatient Rehab Admissions Coordinator:   I do not have insurance auth or a bed for Pt. On CIR today, but will follow for potential admit pending auth and bed availability. Significant other and Pt. Notified.   Megan Salon, MS, CCC-SLP Rehab Admissions Coordinator  [phone] (celll) [phone] (office)
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
## 2020-12-29 NOTE — Progress Notes (Addendum)
  Progress Note    12/29/2020 7:25 AM 4 Days Post-Op  Subjective:  Some sensation in R thumb   Vitals:   12/28/20 2000 12/29/20 0346  BP: (!) 133/59 (!) 143/68  Pulse: 90 74  Resp: 18 17  Temp: 99.1 F (37.3 C) 98.7 F (37.1 C)  SpO2: 93% 91%   Physical Exam: Lungs:  Non labored Incisions:  R neck and arm incisions c/d/i; RLE vein harvest incisions with some serous drainage but healing well Extremities:  Palpable DP pulses Neurologic: A&O  CBC    Component Value Date/Time   WBC 6.2 12/29/2020 0205   RBC 3.08 (L) 12/29/2020 0205   HGB 9.7 (L) 12/29/2020 0205   HCT 27.7 (L) 12/29/2020 0205   PLT 141 (L) 12/29/2020 0205   MCV 89.9 12/29/2020 0205   MCH 31.5 12/29/2020 0205   MCHC 35.0 12/29/2020 0205   RDW 13.1 12/29/2020 0205    BMET    Component Value Date/Time   NA 138 12/29/2020 0205   K 3.5 12/29/2020 0205   CL 100 12/29/2020 0205   CO2 29 12/29/2020 0205   GLUCOSE 104 (H) 12/29/2020 0205   BUN 7 12/29/2020 0205   CREATININE 1.04 12/29/2020 0205   CALCIUM 8.6 (L) 12/29/2020 0205   GFRNONAA >60 12/29/2020 0205    INR    Component Value Date/Time   INR 1.1 12/25/2020 1156     Intake/Output Summary (Last 24 hours) at 12/29/2020 0725 Last data filed at 12/28/2020 2300 Gross per 24 hour  Intake 120 ml  Output 700 ml  Net -580 ml     Assessment/Plan:  30 y.o. male is s/p repair of R subclavian artery injury 4 Days Post-Op   R hand well perfused with palpable R radial pulse RUE incisions healing well Some serous drainage from RLE vein harvest incisions but healing well Continue 81mg  aspirin Will refer to Dr. as outpatient due to brachial plexus transection   Dierdre Searles, PA-C [facility] [phone] 12/29/2020 7:25 AM  I have interviewed the patient and examined the patient. I agree with the findings by the PA.  Continue dry 4 x 4's to vein harvest incision right thigh until drainage stops.  12/31/2020,  MD [phone]
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
## 2020-12-30 LAB — BPAM RBC
Blood Product Expiration Date: 202206052359
Blood Product Expiration Date: 202206152359
Blood Product Expiration Date: 202206152359
Blood Product Expiration Date: 202206162359
Blood Product Expiration Date: 202206162359
Blood Product Expiration Date: 202206162359
Blood Product Expiration Date: 202206162359
Blood Product Expiration Date: 202206172359
Blood Product Expiration Date: 202206172359
Blood Product Expiration Date: 202206172359
Blood Product Expiration Date: 202206172359
ISSUE DATE / TIME: 202205201147
ISSUE DATE / TIME: 202205201205
ISSUE DATE / TIME: 202205201419
ISSUE DATE / TIME: 202205201419
ISSUE DATE / TIME: 202205201736
ISSUE DATE / TIME: 202205201736
ISSUE DATE / TIME: 202205201824
ISSUE DATE / TIME: 202205201824
ISSUE DATE / TIME: 202205210007
ISSUE DATE / TIME: 202205221047
ISSUE DATE / TIME: 202205240806
Unit Type and Rh: 5100
Unit Type and Rh: 5100
Unit Type and Rh: 5100
Unit Type and Rh: 5100
Unit Type and Rh: 5100
Unit Type and Rh: 5100
Unit Type and Rh: 5100
Unit Type and Rh: 5100
Unit Type and Rh: 5100
Unit Type and Rh: 5100
Unit Type and Rh: 5100

## 2020-12-30 LAB — TYPE AND SCREEN
ABO/RH(D): O POS
Antibody Screen: NEGATIVE
Unit division: 0
Unit division: 0
Unit division: 0
Unit division: 0
Unit division: 0
Unit division: 0
Unit division: 0
Unit division: 0
Unit division: 0
Unit division: 0
Unit division: 0

## 2020-12-30 LAB — POCT I-STAT 7, (LYTES, BLD GAS, ICA,H+H)
Acid-Base Excess: 1 mmol/L (ref 0.0–2.0)
Bicarbonate: 24.9 mmol/L (ref 20.0–28.0)
Calcium, Ion: 1.08 mmol/L — ABNORMAL LOW (ref 1.15–1.40)
HCT: 29 % — ABNORMAL LOW (ref 39.0–52.0)
Hemoglobin: 9.9 g/dL — ABNORMAL LOW (ref 13.0–17.0)
O2 Saturation: 100 %
Potassium: 3.3 mmol/L — ABNORMAL LOW (ref 3.5–5.1)
Sodium: 142 mmol/L (ref 135–145)
TCO2: 26 mmol/L (ref 22–32)
pCO2 arterial: 34.5 mmHg (ref 32.0–48.0)
pH, Arterial: 7.467 — ABNORMAL HIGH (ref 7.350–7.450)
pO2, Arterial: 358 mmHg — ABNORMAL HIGH (ref 83.0–108.0)

## 2020-12-30 LAB — CBC
HCT: 28 % — ABNORMAL LOW (ref 39.0–52.0)
Hemoglobin: 9.8 g/dL — ABNORMAL LOW (ref 13.0–17.0)
MCH: 31.2 pg (ref 26.0–34.0)
MCHC: 35 g/dL (ref 30.0–36.0)
MCV: 89.2 fL (ref 80.0–100.0)
Platelets: 199 10*3/uL (ref 150–400)
RBC: 3.14 MIL/uL — ABNORMAL LOW (ref 4.22–5.81)
RDW: 12.7 % (ref 11.5–15.5)
WBC: 4.3 10*3/uL (ref 4.0–10.5)
nRBC: 0.5 % — ABNORMAL HIGH (ref 0.0–0.2)

## 2020-12-30 LAB — BASIC METABOLIC PANEL
Anion gap: 8 (ref 5–15)
BUN: 10 mg/dL (ref 6–20)
CO2: 27 mmol/L (ref 22–32)
Calcium: 8.6 mg/dL — ABNORMAL LOW (ref 8.9–10.3)
Chloride: 99 mmol/L (ref 98–111)
Creatinine, Ser: 0.97 mg/dL (ref 0.61–1.24)
GFR, Estimated: >60 mL/min (ref >60)
Glucose, Bld: 113 mg/dL — ABNORMAL HIGH (ref 70–99)
Potassium: 3.8 mmol/L (ref 3.5–5.1)
Sodium: 134 mmol/L — ABNORMAL LOW (ref 135–145)

## 2020-12-30 MED ORDER — METHOCARBAMOL 750 MG PO TABS
750.0000 mg | ORAL_TABLET | Freq: Four times a day (QID) | ORAL | Status: DC
  Administered 2020-12-30 – 2021-01-08 (×40): 750 mg via ORAL
  Filled 2020-12-30 (×40): qty 1

## 2020-12-30 MED ORDER — LIDOCAINE 5 % EX PTCH
1.0000 | MEDICATED_PATCH | CUTANEOUS | Status: DC
  Administered 2020-12-30 – 2021-01-05 (×7): 1 via TRANSDERMAL
  Filled 2020-12-30 (×9): qty 1

## 2020-12-30 MED ORDER — SIMETHICONE 80 MG PO CHEW: 80.0000 mg | CHEWABLE_TABLET | Freq: Four times a day (QID) | ORAL | Status: DC | PRN

## 2020-12-30 MED ORDER — GABAPENTIN 400 MG PO CAPS
400.0000 mg | ORAL_CAPSULE | Freq: Three times a day (TID) | ORAL | Status: DC
  Administered 2020-12-30 – 2021-01-08 (×30): 400 mg via ORAL
  Filled 2020-12-30 (×30): qty 1

## 2020-12-30 MED ORDER — HYDROMORPHONE HCL 1 MG/ML IJ SOLN
0.5000 mg | Freq: Four times a day (QID) | INTRAMUSCULAR | Status: DC | PRN
  Administered 2020-12-30: 1 mg via INTRAVENOUS
  Filled 2020-12-30: qty 1

## 2020-12-30 NOTE — Progress Notes (Addendum)
[facility]  Subjective  - Right neck and arm pain.  Objective 129/76 (!) 59 98.5 F (36.9 C) (Oral) 20 97%  Intake/Output Summary (Last 24 hours) at 12/30/2020 0735 Last data filed at 12/30/2020 0425 Gross per 24 hour  Intake 480 ml  Output 2625 ml  Net -2145 ml   All incisions are healing well with out drainage.  Clean dressings applied.  No frank sign of infection. Palpable radial pulse Vein harvest site dressing clean and dry, recently changed No active motor/sensation in right hand, pain sensation intact upper arm  Assessment/Planning: POD # 5  s/p repair of R subclavian artery injury  Right UE well perfused with palpable radial pulse Incisions healing well, vein harvest site with minimal drainage druy dressing in place. 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 I agree with the findings by the PA.  Palpable right radial pulse.  He is on aspirin.  Vascular surgery will follow peripherally.  12/27/2020, MD
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
## 2020-12-30 NOTE — Progress Notes (Signed)
Inpatient Rehab Admissions Coordinator:   Pt. Is not medically ready for CIR today. Per Trauma service, they are working on transitioning from IV to oral pain meds today with hope of discontinuing IV pain meds this evening. I also await insurance authorization from the [place] and have not received a response yet. I will continue to follow for potential admit pending insurance auth, bed availability, and medical readiness.   Megan Salon, MS, CCC-SLP Rehab Admissions Coordinator  [phone] (celll) [phone] (office)

## 2020-12-30 NOTE — Progress Notes (Signed)
Attempted to return call to fiance, unable to reach her by phone at this time.
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
## 2020-12-30 NOTE — Progress Notes (Signed)
Pt's fiance called, informed me that pt called her to let her know he is in pain. He told her he did not get full dose of pain medicine at last administration. 1 mg dilaudid given at 2038 on 5/24.  Pt then fell asleep. I was getting medicine at the time, pain medicine removed from pyxis for patient as well.  Entered pt's room, informed him that I had his pain medicine and he declined to take it at that time.  Stated he wanted someone else in the room.  CNA entered room with me, introduced herself and informed pt that I had his pain medicine.  He let me administer it this time.  He asked if it was the full dose.  Education provided that 1 mg of dilaudid is ordered and is what I had given earlier in the shift.  Informed pt that he can have the medicine again at 0722 and I will check on his pain at that time, or let the oncoming nurse know that he can have it, to ensure pain is better managed.  Pt expressed understanding.

## 2020-12-31 MED ORDER — LACTATED RINGERS IV BOLUS
500.0000 mL | Freq: Once | INTRAVENOUS | Status: AC
  Administered 2020-12-31: 500 mL via INTRAVENOUS

## 2020-12-31 MED ORDER — ACETAMINOPHEN 500 MG PO TABS
500.0000 mg | ORAL_TABLET | Freq: Four times a day (QID) | ORAL | Status: DC
  Administered 2021-01-01 – 2021-01-08 (×27): 500 mg via ORAL
  Filled 2020-12-31 (×33): qty 1

## 2020-12-31 MED ORDER — BUTALBITAL-APAP-CAFFEINE 50-325-40 MG PO TABS
2.0000 | ORAL_TABLET | ORAL | Status: AC | PRN
  Administered 2020-12-31 – 2021-01-02 (×7): 2 via ORAL
  Filled 2020-12-31 (×7): qty 2

## 2020-12-31 MED ORDER — ENSURE ENLIVE PO LIQD
237.0000 mL | Freq: Two times a day (BID) | ORAL | Status: DC
  Administered 2020-12-31 – 2021-01-05 (×7): 237 mL via ORAL
  Filled 2020-12-31: qty 237

## 2020-12-31 MED ORDER — MECLIZINE HCL 25 MG PO TABS
25.0000 mg | ORAL_TABLET | Freq: Three times a day (TID) | ORAL | Status: DC | PRN
  Filled 2020-12-31: qty 1

## 2020-12-31 NOTE — Progress Notes (Signed)
IP rehab admissions - Noted patient medically ready for CIR.  Awaiting call back and decision from [place] insurance regarding potential CIR admission.  Call for questions.  [phone]
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
## 2021-01-01 ENCOUNTER — Other Ambulatory Visit: Payer: Self-pay

## 2021-01-01 MED ORDER — HYDROGEN PEROXIDE 3 % EX SOLN
Freq: Two times a day (BID) | CUTANEOUS | Status: DC | PRN
  Filled 2021-01-01: qty 473
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
## 2021-01-01 NOTE — Progress Notes (Addendum)
At bedside for IV start per consult.  Patient states that he does not have any IV meds.  MAR reviewed, patient prefers not to be stuck.  No orders to maintain IV access.  Bedside nurse Janelle Floor, RN present and discussed.  Will re enter consult if IV meds are added or patient's condition changes to need IV access.

## 2021-01-01 NOTE — Progress Notes (Signed)
Inpatient Rehab Admissions Coordinator:   I do not have insurance auth or a bed on CIR for this Pt. Today. I continue to await approval from the [facility]. Pt. And family updated. I will continue to follow for potential admit pending bed availability and insurance auth.   Megan Salon, MS, CCC-SLP Rehab Admissions Coordinator  [phone] (celll) [phone] (office)
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
## 2021-01-02 MED ORDER — MAGNESIUM HYDROXIDE 400 MG/5ML PO SUSP
15.0000 mL | Freq: Once | ORAL | Status: AC
  Administered 2021-01-02: 15 mL via ORAL
  Filled 2021-01-02: qty 30
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
## 2021-01-03 MED ORDER — BISACODYL 10 MG RE SUPP: 10.0000 mg | Freq: Every day | RECTAL | Status: DC | PRN
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
## 2021-01-03 NOTE — Progress Notes (Signed)
Using Morris with pt with good toleration.

## 2021-01-03 NOTE — Progress Notes (Signed)
Orthopedic Tech Progress Note Patient Details:  Robert Good [birth year] [id number]  Ortho Devices Type of Ortho Device: Sling immobilizer Ortho Device/Splint Location: Right Upper Extremity Ortho Device/Splint Interventions: Ordered (pt did not want sling applied at the time of my arrival, sling was left at bedside.)   Post Interventions Patient Tolerated: Well Instructions Provided: Adjustment of device,Care of device,Poper ambulation with device   Gerald Stabs 01/03/2021, 12:31 PM
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
## 2021-01-03 NOTE — Discharge Instructions (Signed)
Incision Care, Adult An incision is a surgical cut that is made through your skin. Most incisions are closed after a surgical procedure. Your incision may be closed with stitches (sutures), staples, skin glue, or adhesive strips. You may need to return to your health care provider to have sutures or staples removed. This may occur several days or several weeks after your surgery. Until then, the incision needs to be cared for properly to prevent infection. Follow instructions from your health care provider about how to care for your incision. Supplies needed:  Soap, water, and a clean hand towel.  Wound cleanser.  A clean bandage (dressing), if needed.  Cream or ointment, if told by your health care provider.  Clean gauze. How to care for your incision Cleaning the incision Ask your health care provider how to clean the incision. This may include:  Using mild soap and water, or wound cleanser.  Using a clean gauze to pat the incision dry after cleaning it. Dressing changes  Wash your hands with soap and water for at least 20 seconds before and after you change the dressing. If soap and water are not available, use hand sanitizer.  Change your dressing as told by your health care provider.  Leave sutures, staples, skin glue, or adhesive strips in place. These skin closures may need to stay in place for 2 weeks or longer. If adhesive strip edges start to loosen and curl up, you may trim the loose edges. Do not remove adhesive strips completely unless your health care provider tells you to do that.  Apply cream or ointment. Do this only as told by your health care provider.  Cover the incision with a clean dressing. Ask your health care provider when you can begin leaving the incision uncovered. Checking for infection Check your incision area every day for signs of infection. Check for:  More redness, swelling, or pain.  More fluid or blood.  Warmth.  Pus or a bad smell.    Follow these instructions at home Medicines  Take over-the-counter and prescription medicines only as told by your health care provider.  If you were prescribed an antibiotic medicine, cream, or ointment, take or apply it as told by your health care provider. Do not stop using the antibiotic even if your condition improves. Eating and drinking  Eat a diet that includes protein, vitamin A, vitamin C, and other nutrient-rich foods to help the wound heal. ? Foods rich in protein include meat, fish, eggs, dairy, beans, and nuts. ? Foods rich in vitamin A include carrots and dark green, leafy vegetables. ? Foods rich in vitamin C include citrus fruits, tomatoes, broccoli, and peppers.  Drink enough fluid to keep your urine pale yellow. General instructions  Do not take baths, swim, use a hot tub, or do anything that would put the incision underwater until your health care provider approves. Ask your health care provider if you may take showers. You may only be allowed to take sponge baths.  Limit movement around your incision to promote healing. ? Avoid straining, lifting, or exercising for the first 2 weeks after your procedure, or for as long as told by your health care provider. ? Return to your normal activities as told by your health care provider. Ask your health care provider what activities are safe for you.  Do not scratch or pick at the incision. Keep it covered as told by your health care provider.  Protect your incision from the sun when you are   outside for the first 6 months, or for as long as told by your health care provider. Cover up the scar area or apply sunscreen that has an SPF of at least 30.  Do not use any products that contain nicotine or tobacco, such as cigarettes, e-cigarettes, and chewing tobacco. These can delay incision healing after surgery. If you need help quitting, ask your health care provider.  Keep all follow-up visits as told by your health care  provider. This is important.   Contact a health care provider if:  You have any of these signs of infection: ? More redness, swelling, or pain around your incision. ? More fluid or blood coming from your incision. ? Warmth coming from your incision. ? Pus or a bad smell coming from your incision. ? A fever.  You are nauseous or you vomit.  You are dizzy.  Your sutures, staples, skin glue, or adhesive strips come undone. Get help right away if:  You have a red streak on the skin near your incision.  Your incision bleeds through the dressing and the bleeding does not stop with gentle pressure.  The edges of your incision open up and separate.  You have signs of a serious bodily reaction to an infection. These signs may include: ? Fever, shaking chills, or feeling very cold. ? Confusion or anxiety. ? Severe pain. ? Trouble breathing. ? Fast heartbeat. ? Clammy or sweaty skin. ? A rash. These symptoms may represent a serious problem that is an emergency. Do not wait to see if the symptoms will go away. Get medical help right away. Call your local emergency services (911 in the [place] Do not drive yourself to the hospital. Summary  Follow instructions from your health care provider about how to care for your incision.  Wash your hands with soap and water for at least 20 seconds before and after you change the dressing. If soap and water are not available, use hand sanitizer.  Check your incision area every day for signs of infection.  Keep all follow-up visits as told by your health care provider. This is important. This information is not intended to replace advice given to you by your health care provider. Make sure you discuss any questions you have with your health care provider. Document Revised: 05/15/2019 Document Reviewed: 05/15/2019 Elsevier Patient Education  2021 [employer]   

## 2021-01-04 ENCOUNTER — Encounter (HOSPITAL_COMMUNITY): Payer: Self-pay | Admitting: Student

## 2021-01-04 LAB — CBC WITH DIFFERENTIAL/PLATELET
Abs Immature Granulocytes: 0.09 10*3/uL — ABNORMAL HIGH (ref 0.00–0.07)
Basophils Absolute: 0 10*3/uL (ref 0.0–0.1)
Basophils Relative: 0 %
Eosinophils Absolute: 0 10*3/uL (ref 0.0–0.5)
Eosinophils Relative: 0 %
HCT: 29.8 % — ABNORMAL LOW (ref 39.0–52.0)
Hemoglobin: 10 g/dL — ABNORMAL LOW (ref 13.0–17.0)
Immature Granulocytes: 1 %
Lymphocytes Relative: 13 %
Lymphs Abs: 1.2 10*3/uL (ref 0.7–4.0)
MCH: 31.6 pg (ref 26.0–34.0)
MCHC: 33.6 g/dL (ref 30.0–36.0)
MCV: 94.3 fL (ref 80.0–100.0)
Monocytes Absolute: 0.7 10*3/uL (ref 0.1–1.0)
Monocytes Relative: 8 %
Neutro Abs: 7.1 10*3/uL (ref 1.7–7.7)
Neutrophils Relative %: 78 %
Platelets: 594 10*3/uL — ABNORMAL HIGH (ref 150–400)
RBC: 3.16 MIL/uL — ABNORMAL LOW (ref 4.22–5.81)
RDW: 14.5 % (ref 11.5–15.5)
WBC: 9.2 10*3/uL (ref 4.0–10.5)
nRBC: 0.3 % — ABNORMAL HIGH (ref 0.0–0.2)

## 2021-01-04 LAB — BASIC METABOLIC PANEL
Anion gap: 6 (ref 5–15)
BUN: 18 mg/dL (ref 6–20)
CO2: 27 mmol/L (ref 22–32)
Calcium: 9.1 mg/dL (ref 8.9–10.3)
Chloride: 102 mmol/L (ref 98–111)
Creatinine, Ser: 1.05 mg/dL (ref 0.61–1.24)
GFR, Estimated: >60 mL/min (ref >60)
Glucose, Bld: 105 mg/dL — ABNORMAL HIGH (ref 70–99)
Potassium: 4.5 mmol/L (ref 3.5–5.1)
Sodium: 135 mmol/L (ref 135–145)
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
## 2021-01-04 NOTE — Progress Notes (Signed)
Inpatient Rehab Admissions Coordinator:    I do not have a CIR bed for this Pt. Today and continue to await decision from [place]. I will continue to follow and pursue for admission pending bed availability and insurance auth. Pt and family were updated.  Megan Salon, MS, CCC-SLP Rehab Admissions Coordinator  [phone] (celll) [phone] (office)
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
## 2021-01-05 NOTE — Progress Notes (Signed)
Inpatient Rehab Admissions Coordinator:   I continue to await insurance auth for CIR from the [place]; however, I did receive notice that [facility] has sent case to medical director for review and should receive a response in the next 24-48 hours. Pt. Updated.   Megan Salon, MS, CCC-SLP Rehab Admissions Coordinator  [phone] (celll) [phone] (office)

## 2021-01-05 NOTE — Plan of Care (Signed)
  Problem: Clinical Measurements: Goal: Will remain free from infection Outcome: Progressing   Problem: Pain Managment: Goal: General experience of comfort will improve Outcome: Progressing   Problem: Education: Goal: Knowledge of General Education information will improve Description: Including pain rating scale, medication(s)/side effects and non-pharmacologic comfort measures Outcome: Progressing   Problem: Health Behavior/Discharge Planning: Goal: Ability to manage health-related needs will improve Outcome: Progressing   Problem: Clinical Measurements: Goal: Ability to maintain clinical measurements within normal limits will improve Outcome: Progressing Goal: Will remain free from infection Outcome: Progressing Goal: Diagnostic test results will improve Outcome: Progressing Goal: Respiratory complications will improve Outcome: Progressing Goal: Cardiovascular complication will be avoided Outcome: Progressing   Problem: Activity: Goal: Risk for activity intolerance will decrease Outcome: Progressing   Problem: Nutrition: Goal: Adequate nutrition will be maintained Outcome: Progressing   Problem: Coping: Goal: Level of anxiety will decrease Outcome: Progressing   Problem: Elimination: Goal: Will not experience complications related to bowel motility Outcome: Progressing Goal: Will not experience complications related to urinary retention Outcome: Progressing   Problem: Pain Managment: Goal: General experience of comfort will improve Outcome: Progressing   Problem: Safety: Goal: Ability to remain free from injury will improve Outcome: Progressing   Problem: Skin Integrity: Goal: Risk for impaired skin integrity will decrease Outcome: Progressing

## 2021-01-06 ENCOUNTER — Encounter (HOSPITAL_COMMUNITY): Payer: Self-pay | Admitting: Student

## 2021-01-06 NOTE — Progress Notes (Signed)
Inpatient Rehab Admissions Coordinator:   I have received insurance authorization from the [place] for CIR admit. Now await bed availability.   Megan Salon, MS, CCC-SLP Rehab Admissions Coordinator  [phone] (celll) [phone] (office)

## 2021-01-06 NOTE — Progress Notes (Signed)
Inpatient Rehab Admissions Coordinator:    I do not yet have authorization from the [place], [place]; however, case manager contacted me this morning to tell me that she hopes to have a response from their medical director by end of day today or tomorrow at the latest. I notified Pt. I will continue to follow for potential admission pending insurance auth and bed availability.   Megan Salon, MS, CCC-SLP Rehab Admissions Coordinator  [phone] (celll) [phone] (office)
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
## 2021-01-06 NOTE — Progress Notes (Signed)
Hopeful for discharge today to inpatient rehab. Staples removed from right anterior chest and right lower extremity.  We will make follow-up arrangements with Dr Edilia Bo in approximately 4-6 weeks with arterial duplex. Continue aspirin 81 mg daily.
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
## 2021-01-07 NOTE — Progress Notes (Signed)
Made Dr. Cliffton Asters aware of patient increased burning and tingling nerve pain in right arm radiating to hand. Per MD, ok to administer PRN 10 mg oxycodone at this time.

## 2021-01-07 NOTE — Progress Notes (Signed)
IP rehab admissions - I have no beds available on CIR today.  Will follow up again tomorrow for bed availability.  Call for questions.  [phone]
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
## 2021-01-08 ENCOUNTER — Inpatient Hospital Stay (HOSPITAL_COMMUNITY)
Admission: RE | Admit: 2021-01-08 | Discharge: 2021-01-19 | DRG: 949 | Disposition: A | Discharge status: Home Health | Payer: No Typology Code available for payment source | Source: Intra-hospital | Attending: Physical Medicine & Rehabilitation | Admitting: Physical Medicine & Rehabilitation

## 2021-01-08 ENCOUNTER — Encounter (HOSPITAL_COMMUNITY): Payer: Self-pay | Admitting: General Surgery

## 2021-01-08 ENCOUNTER — Other Ambulatory Visit: Payer: Self-pay

## 2021-01-08 ENCOUNTER — Encounter (HOSPITAL_COMMUNITY): Payer: Self-pay | Admitting: Physical Medicine & Rehabilitation

## 2021-01-08 DIAGNOSIS — R42 Dizziness and giddiness: Secondary | ICD-10-CM | POA: Diagnosis present

## 2021-01-08 DIAGNOSIS — S143XXA Injury of brachial plexus, initial encounter: Secondary | ICD-10-CM | POA: Diagnosis present

## 2021-01-08 DIAGNOSIS — K5900 Constipation, unspecified: Secondary | ICD-10-CM | POA: Diagnosis present

## 2021-01-08 DIAGNOSIS — D75838 Other thrombocytosis: Secondary | ICD-10-CM | POA: Diagnosis present

## 2021-01-08 DIAGNOSIS — S51811A Laceration without foreign body of right forearm, initial encounter: Secondary | ICD-10-CM | POA: Diagnosis present

## 2021-01-08 DIAGNOSIS — S25191D Other specified injury of right innominate or subclavian artery, subsequent encounter: Secondary | ICD-10-CM | POA: Diagnosis not present

## 2021-01-08 DIAGNOSIS — D62 Acute posthemorrhagic anemia: Secondary | ICD-10-CM

## 2021-01-08 DIAGNOSIS — S7292XE Unspecified fracture of left femur, subsequent encounter for open fracture type I or II with routine healing: Secondary | ICD-10-CM

## 2021-01-08 DIAGNOSIS — S61411D Laceration without foreign body of right hand, subsequent encounter: Secondary | ICD-10-CM | POA: Diagnosis not present

## 2021-01-08 DIAGNOSIS — E871 Hypo-osmolality and hyponatremia: Secondary | ICD-10-CM | POA: Diagnosis present

## 2021-01-08 DIAGNOSIS — S72002D Fracture of unspecified part of neck of left femur, subsequent encounter for closed fracture with routine healing: Secondary | ICD-10-CM | POA: Diagnosis not present

## 2021-01-08 DIAGNOSIS — G629 Polyneuropathy, unspecified: Secondary | ICD-10-CM

## 2021-01-08 DIAGNOSIS — S51811D Laceration without foreign body of right forearm, subsequent encounter: Secondary | ICD-10-CM

## 2021-01-08 DIAGNOSIS — S82402E Unspecified fracture of shaft of left fibula, subsequent encounter for open fracture type I or II with routine healing: Secondary | ICD-10-CM

## 2021-01-08 DIAGNOSIS — G4700 Insomnia, unspecified: Secondary | ICD-10-CM | POA: Diagnosis present

## 2021-01-08 DIAGNOSIS — K5903 Drug induced constipation: Secondary | ICD-10-CM | POA: Diagnosis not present

## 2021-01-08 DIAGNOSIS — S72302S Unspecified fracture of shaft of left femur, sequela: Secondary | ICD-10-CM

## 2021-01-08 DIAGNOSIS — S143XXD Injury of brachial plexus, subsequent encounter: Secondary | ICD-10-CM | POA: Diagnosis present

## 2021-01-08 DIAGNOSIS — S143XXS Injury of brachial plexus, sequela: Secondary | ICD-10-CM

## 2021-01-08 DIAGNOSIS — T148XXA Other injury of unspecified body region, initial encounter: Secondary | ICD-10-CM

## 2021-01-08 DIAGNOSIS — I6389 Other cerebral infarction: Secondary | ICD-10-CM | POA: Diagnosis not present

## 2021-01-08 DIAGNOSIS — E559 Vitamin D deficiency, unspecified: Secondary | ICD-10-CM | POA: Diagnosis present

## 2021-01-08 DIAGNOSIS — S42121D Displaced fracture of acromial process, right shoulder, subsequent encounter for fracture with routine healing: Secondary | ICD-10-CM

## 2021-01-08 DIAGNOSIS — S72009A Fracture of unspecified part of neck of unspecified femur, initial encounter for closed fracture: Secondary | ICD-10-CM | POA: Diagnosis present

## 2021-01-08 DIAGNOSIS — S82202E Unspecified fracture of shaft of left tibia, subsequent encounter for open fracture type I or II with routine healing: Secondary | ICD-10-CM

## 2021-01-08 MED ORDER — PANTOPRAZOLE SODIUM 40 MG PO TBEC
40.0000 mg | DELAYED_RELEASE_TABLET | Freq: Every day | ORAL | Status: DC
  Administered 2021-01-09 – 2021-01-19 (×11): 40 mg via ORAL
  Filled 2021-01-08 (×11): qty 1

## 2021-01-08 MED ORDER — POLYETHYLENE GLYCOL 3350 17 G PO PACK
17.0000 g | PACK | Freq: Every day | ORAL | Status: DC
  Administered 2021-01-12 – 2021-01-16 (×4): 17 g via ORAL
  Filled 2021-01-08 (×11): qty 1

## 2021-01-08 MED ORDER — OXYCODONE HCL 5 MG PO TABS
5.0000 mg | ORAL_TABLET | ORAL | Status: DC | PRN
  Administered 2021-01-09 – 2021-01-13 (×18): 10 mg via ORAL
  Administered 2021-01-14: 5 mg via ORAL
  Administered 2021-01-14 (×3): 10 mg via ORAL
  Administered 2021-01-15: 5 mg via ORAL
  Administered 2021-01-16 – 2021-01-19 (×9): 10 mg via ORAL
  Filled 2021-01-08 (×16): qty 2
  Filled 2021-01-08: qty 1
  Filled 2021-01-08 (×3): qty 2
  Filled 2021-01-08: qty 1
  Filled 2021-01-08 (×12): qty 2

## 2021-01-08 MED ORDER — MUSCLE RUB 10-15 % EX CREA
TOPICAL_CREAM | Freq: Three times a day (TID) | CUTANEOUS | Status: DC
  Administered 2021-01-12 – 2021-01-16 (×3): 1 via TOPICAL
  Filled 2021-01-08: qty 85

## 2021-01-08 MED ORDER — BISACODYL 10 MG RE SUPP: 10.0000 mg | Freq: Every day | RECTAL | Status: DC | PRN

## 2021-01-08 MED ORDER — METOCLOPRAMIDE HCL 5 MG/ML IJ SOLN: 5.0000 mg | Freq: Three times a day (TID) | INTRAMUSCULAR | Status: DC | PRN

## 2021-01-08 MED ORDER — POTASSIUM CHLORIDE CRYS ER 20 MEQ PO TBCR
20.0000 meq | EXTENDED_RELEASE_TABLET | Freq: Two times a day (BID) | ORAL | Status: DC
  Administered 2021-01-09 – 2021-01-19 (×21): 20 meq via ORAL
  Filled 2021-01-08 (×21): qty 1

## 2021-01-08 MED ORDER — ACETAMINOPHEN 325 MG PO TABS: 325.0000 mg | ORAL_TABLET | ORAL | Status: DC | PRN

## 2021-01-08 MED ORDER — DIPHENHYDRAMINE HCL 12.5 MG/5ML PO ELIX: 12.5000 mg | ORAL_SOLUTION | Freq: Four times a day (QID) | ORAL | Status: DC | PRN

## 2021-01-08 MED ORDER — PROCHLORPERAZINE 25 MG RE SUPP: 12.5000 mg | Freq: Four times a day (QID) | RECTAL | Status: DC | PRN

## 2021-01-08 MED ORDER — FLEET ENEMA 7-19 GM/118ML RE ENEM: 1.0000 | ENEMA | Freq: Once | RECTAL | Status: DC | PRN

## 2021-01-08 MED ORDER — DOCUSATE SODIUM 100 MG PO CAPS
100.0000 mg | ORAL_CAPSULE | Freq: Two times a day (BID) | ORAL | Status: DC
  Administered 2021-01-09 – 2021-01-18 (×19): 100 mg via ORAL
  Filled 2021-01-08 (×21): qty 1

## 2021-01-08 MED ORDER — GUAIFENESIN-DM 100-10 MG/5ML PO SYRP: 5.0000 mL | ORAL_SOLUTION | Freq: Four times a day (QID) | ORAL | Status: DC | PRN

## 2021-01-08 MED ORDER — ALUM & MAG HYDROXIDE-SIMETH 200-200-20 MG/5ML PO SUSP
30.0000 mL | ORAL | Status: DC | PRN
Start: 2021-01-08 — End: 2021-01-19

## 2021-01-08 MED ORDER — ASPIRIN EC 81 MG PO TBEC
81.0000 mg | DELAYED_RELEASE_TABLET | Freq: Every day | ORAL | Status: DC
  Administered 2021-01-09 – 2021-01-19 (×11): 81 mg via ORAL
  Filled 2021-01-08 (×11): qty 1

## 2021-01-08 MED ORDER — VITAMIN D (ERGOCALCIFEROL) 1.25 MG (50000 UNIT) PO CAPS
50000.0000 [IU] | ORAL_CAPSULE | ORAL | Status: DC
  Administered 2021-01-11 – 2021-01-18 (×2): 50000 [IU] via ORAL
  Filled 2021-01-08 (×2): qty 1

## 2021-01-08 MED ORDER — SIMETHICONE 80 MG PO CHEW: 80.0000 mg | CHEWABLE_TABLET | Freq: Four times a day (QID) | ORAL | Status: DC | PRN

## 2021-01-08 MED ORDER — TRAZODONE HCL 50 MG PO TABS
25.0000 mg | ORAL_TABLET | Freq: Every evening | ORAL | Status: DC | PRN
  Administered 2021-01-13 – 2021-01-18 (×5): 50 mg via ORAL
  Filled 2021-01-08 (×5): qty 1

## 2021-01-08 MED ORDER — GABAPENTIN 300 MG PO CAPS
600.0000 mg | ORAL_CAPSULE | Freq: Three times a day (TID) | ORAL | Status: DC
  Administered 2021-01-09 – 2021-01-12 (×10): 600 mg via ORAL
  Filled 2021-01-08 (×10): qty 2

## 2021-01-08 MED ORDER — POLYETHYLENE GLYCOL 3350 17 G PO PACK
17.0000 g | PACK | Freq: Every day | ORAL | Status: DC | PRN
Start: 2021-01-08 — End: 2021-01-19

## 2021-01-08 MED ORDER — ACETAMINOPHEN 500 MG PO TABS
500.0000 mg | ORAL_TABLET | Freq: Three times a day (TID) | ORAL | Status: DC
  Administered 2021-01-09 – 2021-01-19 (×39): 500 mg via ORAL
  Filled 2021-01-08 (×42): qty 1

## 2021-01-08 MED ORDER — CYCLOBENZAPRINE HCL 5 MG PO TABS
5.0000 mg | ORAL_TABLET | Freq: Three times a day (TID) | ORAL | Status: DC
  Administered 2021-01-09 – 2021-01-19 (×31): 5 mg via ORAL
  Filled 2021-01-08 (×31): qty 1

## 2021-01-08 MED ORDER — ENOXAPARIN SODIUM 30 MG/0.3ML IJ SOSY
30.0000 mg | PREFILLED_SYRINGE | Freq: Two times a day (BID) | INTRAMUSCULAR | Status: DC
  Administered 2021-01-09 – 2021-01-19 (×21): 30 mg via SUBCUTANEOUS
  Filled 2021-01-08 (×21): qty 0.3

## 2021-01-08 MED ORDER — HYDROGEN PEROXIDE 3 % EX SOLN
Freq: Two times a day (BID) | CUTANEOUS | Status: DC | PRN
  Filled 2021-01-08: qty 473

## 2021-01-08 MED ORDER — PROCHLORPERAZINE EDISYLATE 10 MG/2ML IJ SOLN: 5.0000 mg | Freq: Four times a day (QID) | INTRAMUSCULAR | Status: DC | PRN

## 2021-01-08 MED ORDER — PROCHLORPERAZINE MALEATE 5 MG PO TABS: 5.0000 mg | ORAL_TABLET | Freq: Four times a day (QID) | ORAL | Status: DC | PRN

## 2021-01-08 MED ORDER — LIDOCAINE 5 % EX PTCH
1.0000 | MEDICATED_PATCH | CUTANEOUS | Status: DC
  Administered 2021-01-12: 1 via TRANSDERMAL
  Filled 2021-01-08 (×4): qty 1

## 2021-01-08 MED ORDER — METOCLOPRAMIDE HCL 5 MG PO TABS: 5.0000 mg | ORAL_TABLET | Freq: Three times a day (TID) | ORAL | Status: DC | PRN

## 2021-01-08 MED ORDER — METHOCARBAMOL 750 MG PO TABS
750.0000 mg | ORAL_TABLET | Freq: Four times a day (QID) | ORAL | Status: DC | PRN
  Administered 2021-01-14: 750 mg via ORAL
  Filled 2021-01-08: qty 1

## 2021-01-08 NOTE — Progress Notes (Signed)
Inpatient Rehabilitation Admissions Coordinator  CIR bed is available for patient to admit today. I met with patient at bedside and he is aware and in agreement. I will make the arrangements to admit today.Acute team and TOC made aware.  Danne Baxter, RN, MSN Rehab Admissions Coordinator [phone] 01/08/2021 10:50 AM
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
## 2021-01-08 NOTE — Progress Notes (Signed)
Transferred to 3A19 in bed at this time.

## 2021-01-08 NOTE — TOC Transition Note (Signed)
Transition of Care [facility]) - CM/SW Discharge Note   Patient Details  Name: Robert Good MRN: [id number] Date of Birth: 1991/03/31  [facility] CM/SW Contact:  Glennon Mac, RN Phone Number: 01/08/2021, 4:45 PM   Clinical Narrative:  Pt medically stable for discharge, and bed available today on CIR.  Plan dc to rehab unit when bed ready.      Final next level of care: IP Rehab Facility Barriers to Discharge: Barriers Resolved   Patient Goals and CMS Choice Patient states their goals for this hospitalization and ongoing recovery are:: to get better CMS Medicare.gov Compare Post Acute Care list provided to:: Patient Choice offered to / list presented to : Patient  Discharge Placement                       Discharge Plan and Services   Discharge Planning Services: CM Consult Post Acute Care Choice: IP Rehab                               Social Determinants of Health (SDOH) Interventions     Readmission Risk Interventions No flowsheet data found.  Quintella Baton, RN, BSN  Trauma/Neuro ICU Case Manager [phone]
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
## 2021-01-08 NOTE — Progress Notes (Signed)
Made Hosie Spangle PA aware of center of right axilla incision is not completely closed after the removal of staples. Per PA will notify vascular surgical team for advisement.

## 2021-01-09 ENCOUNTER — Inpatient Hospital Stay (HOSPITAL_COMMUNITY): Payer: No Typology Code available for payment source

## 2021-01-09 DIAGNOSIS — S143XXA Injury of brachial plexus, initial encounter: Secondary | ICD-10-CM

## 2021-01-09 LAB — CBC WITH DIFFERENTIAL/PLATELET
Abs Immature Granulocytes: 0.01 10*3/uL (ref 0.00–0.07)
Basophils Absolute: 0 10*3/uL (ref 0.0–0.1)
Basophils Relative: 0 %
Eosinophils Absolute: 0.1 10*3/uL (ref 0.0–0.5)
Eosinophils Relative: 2 %
HCT: 33 % — ABNORMAL LOW (ref 39.0–52.0)
Hemoglobin: 11 g/dL — ABNORMAL LOW (ref 13.0–17.0)
Immature Granulocytes: 0 %
Lymphocytes Relative: 13 %
Lymphs Abs: 0.8 10*3/uL (ref 0.7–4.0)
MCH: 31.5 pg (ref 26.0–34.0)
MCHC: 33.3 g/dL (ref 30.0–36.0)
MCV: 94.6 fL (ref 80.0–100.0)
Monocytes Absolute: 0.4 10*3/uL (ref 0.1–1.0)
Monocytes Relative: 7 %
Neutro Abs: 4.5 10*3/uL (ref 1.7–7.7)
Neutrophils Relative %: 78 %
Platelets: 773 10*3/uL — ABNORMAL HIGH (ref 150–400)
RBC: 3.49 MIL/uL — ABNORMAL LOW (ref 4.22–5.81)
RDW: 15.1 % (ref 11.5–15.5)
WBC: 5.8 10*3/uL (ref 4.0–10.5)
nRBC: 0 % (ref 0.0–0.2)

## 2021-01-09 LAB — COMPREHENSIVE METABOLIC PANEL
ALT: 40 U/L (ref 0–44)
AST: 25 U/L (ref 15–41)
Albumin: 3.3 g/dL — ABNORMAL LOW (ref 3.5–5.0)
Alkaline Phosphatase: 94 U/L (ref 38–126)
Anion gap: 7 (ref 5–15)
BUN: 18 mg/dL (ref 6–20)
CO2: 27 mmol/L (ref 22–32)
Calcium: 9.6 mg/dL (ref 8.9–10.3)
Chloride: 100 mmol/L (ref 98–111)
Creatinine, Ser: 0.92 mg/dL (ref 0.61–1.24)
GFR, Estimated: >60 mL/min (ref >60)
Glucose, Bld: 104 mg/dL — ABNORMAL HIGH (ref 70–99)
Potassium: 4.1 mmol/L (ref 3.5–5.1)
Sodium: 134 mmol/L — ABNORMAL LOW (ref 135–145)
Total Bilirubin: 0.6 mg/dL (ref 0.3–1.2)
Total Protein: 6.6 g/dL (ref 6.5–8.1)

## 2021-01-09 NOTE — Plan of Care (Signed)
  Problem: RH Balance Goal: LTG Patient will maintain dynamic standing with ADLs (OT) Description: LTG:  Patient will maintain dynamic standing balance with assist during activities of daily living (OT)  Flowsheets (Taken 01/09/2021 1257) LTG: Pt will maintain dynamic standing balance during ADLs with: Supervision/Verbal cueing   Problem: Sit to Stand Goal: LTG:  Patient will perform sit to stand in prep for activites of daily living with assistance level (OT) Description: LTG:  Patient will perform sit to stand in prep for activites of daily living with assistance level (OT) Flowsheets (Taken 01/09/2021 1257) LTG: PT will perform sit to stand in prep for activites of daily living with assistance level: Independent with assistive device   Problem: RH Eating Goal: LTG Patient will perform eating w/assist, cues/equip (OT) Description: LTG: Patient will perform eating with assist, with/without cues using equipment (OT) Flowsheets (Taken 01/09/2021 1257) LTG: Pt will perform eating with assistance level of: Independent with assistive device    Problem: RH Grooming Goal: LTG Patient will perform grooming w/assist,cues/equip (OT) Description: LTG: Patient will perform grooming with assist, with/without cues using equipment (OT) Flowsheets (Taken 01/09/2021 1257) LTG: Pt will perform grooming with assistance level of: Independent with assistive device    Problem: RH Bathing Goal: LTG Patient will bathe all body parts with assist levels (OT) Description: LTG: Patient will bathe all body parts with assist levels (OT) Flowsheets (Taken 01/09/2021 1257) LTG: Pt will perform bathing with assistance level/cueing: Supervision/Verbal cueing   Problem: RH Dressing Goal: LTG Patient will perform upper body dressing (OT) Description: LTG Patient will perform upper body dressing with assist, with/without cues (OT). Flowsheets (Taken 01/09/2021 1257) LTG: Pt will perform upper body dressing with assistance level  of: Independent with assistive device Goal: LTG Patient will perform lower body dressing w/assist (OT) Description: LTG: Patient will perform lower body dressing with assist, with/without cues in positioning using equipment (OT) Flowsheets (Taken 01/09/2021 1257) LTG: Pt will perform lower body dressing with assistance level of: Independent with assistive device   Problem: RH Toileting Goal: LTG Patient will perform toileting task (3/3 steps) with assistance level (OT) Description: LTG: Patient will perform toileting task (3/3 steps) with assistance level (OT)  Flowsheets (Taken 01/09/2021 1257) LTG: Pt will perform toileting task (3/3 steps) with assistance level: Independent with assistive device   Problem: RH Simple Meal Prep Goal: LTG Patient will perform simple meal prep w/assist (OT) Description: LTG: Patient will perform simple meal prep with assistance, with/without cues (OT). Flowsheets (Taken 01/09/2021 1257) LTG: Pt will perform simple meal prep with assistance level of: Supervision/Verbal cueing   Problem: RH Toilet Transfers Goal: LTG Patient will perform toilet transfers w/assist (OT) Description: LTG: Patient will perform toilet transfers with assist, with/without cues using equipment (OT) Flowsheets (Taken 01/09/2021 1257) LTG: Pt will perform toilet transfers with assistance level of: Supervision/Verbal cueing   Problem: RH Tub/Shower Transfers Goal: LTG Patient will perform tub/shower transfers w/assist (OT) Description: LTG: Patient will perform tub/shower transfers with assist, with/without cues using equipment (OT) Flowsheets (Taken 01/09/2021 1257) LTG: Pt will perform tub/shower stall transfers with assistance level of: Supervision/Verbal cueing

## 2021-01-09 NOTE — Progress Notes (Signed)
Pt arrived around 21:40 in bed. Pt alert and oriented x 4. Pain medication were administered prior transfer. Assessment completed, Skin assessment done with 2nd nurse ( see flow sheet).Significant other at bedside.
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
## 2021-01-09 NOTE — Progress Notes (Signed)
Physical Therapy Session Note  Patient Details  Name: Robert Good MRN: [id number] Date of Birth: 1991-05-07  Today's Date: 01/09/2021 PT Individual Time: 1450-1530 PT Individual Time Calculation (min): 40 min   Short Term Goals: Week 1:  PT Short Term Goal 1 (Week 1): Patient will tolerate sitting in wc between therapies for at least 1 hour PT Short Term Goal 2 (Week 1): Patient will complete sit <> stand with LRAD and no more than CGA consistently PT Short Term Goal 3 (Week 1): Patient will transfer bed <> wc with LRAD and no more than CGA consistently PT Short Term Goal 4 (Week 1): Patient will ambulate at least 18ft with LRAD and no more than MinA  Skilled Therapeutic Interventions/Progress Updates:  Patient received supine in bed, agreeable to PT. He reports 8/10 pain, primarily in R UE, premedicated. PT providing rest breaks, distractions and repositioning to assist with pain management. GF and MIL at bedside. He was able to come sit edge of bed with supervision and verbal cues. PT donning CAM boot TotalA. Donned and sized GiveMohr to R UE to allow support to R UE while using hemiwalker for mobility. He was able to come to stand with MinA and transfer to wc via stand pivot with MinA. PT transporting patient in wc to therapy gym for time management and energy conservation. He ambulated 20ft, 63ft with HW + MinA, GiveMohr and chair follow for safety. Patient able to adhere to TTWB on L LE intermittently, but reports no increase in pain when ambulating. Very slow gait speed with step- to pattern maintained. Patient reports that these short distance gait bouts are "exhausting." Patient returning to room in wc, transferring to wc via squat pivot with CGA. Returning supine with supervision. Bed alarm on, call light within reach.   Therapy Documentation Precautions:  Precautions Precautions: Fall Precaution Comments: pt with no sensation in R UE Required Braces or Orthoses:  Sling Restrictions Weight Bearing Restrictions: Yes RUE Weight Bearing: Weight bearing as tolerated LLE Weight Bearing: Touchdown weight bearing Other Position/Activity Restrictions: CAM walker boot on LLE when OOB    Therapy/Group: Individual Therapy  Elizebeth Koller, PT, DPT, CBIS  01/09/2021, 12:46 PM
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
## 2021-01-09 NOTE — Progress Notes (Signed)
Venous duplex lower ext  has been completed. Refer to [facility] under chart review to view preliminary results.   01/09/2021  11:45 AM Mayre Bury, Gerarda Gunther
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
## 2021-01-09 NOTE — Progress Notes (Signed)
Inpatient Rehabilitation Medication Review by a Pharmacist  A complete drug regimen review was completed for this patient to identify any potential clinically significant medication issues.  Clinically significant medication issues were identified:  no  Check AMION for pharmacist assigned to patient if future medication questions/issues arise during this admission.  Time spent performing this drug regimen review (minutes):  10   Pervis Hocking, PharmD PGY1 Pharmacy Resident 01/09/2021 2:16 PM  Please check [email or web address] for unit-specific pharmacy phone numbers.
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
## 2021-01-11 LAB — CBC
HCT: 33.8 % — ABNORMAL LOW (ref 39.0–52.0)
Hemoglobin: 11.1 g/dL — ABNORMAL LOW (ref 13.0–17.0)
MCH: 31.7 pg (ref 26.0–34.0)
MCHC: 32.8 g/dL (ref 30.0–36.0)
MCV: 96.6 fL (ref 80.0–100.0)
Platelets: 736 10*3/uL — ABNORMAL HIGH (ref 150–400)
RBC: 3.5 MIL/uL — ABNORMAL LOW (ref 4.22–5.81)
RDW: 15 % (ref 11.5–15.5)
WBC: 4.1 10*3/uL (ref 4.0–10.5)
nRBC: 0 % (ref 0.0–0.2)

## 2021-01-11 LAB — BASIC METABOLIC PANEL
Anion gap: 6 (ref 5–15)
BUN: 18 mg/dL (ref 6–20)
CO2: 32 mmol/L (ref 22–32)
Calcium: 9.6 mg/dL (ref 8.9–10.3)
Chloride: 101 mmol/L (ref 98–111)
Creatinine, Ser: 1.03 mg/dL (ref 0.61–1.24)
GFR, Estimated: >60 mL/min (ref >60)
Glucose, Bld: 99 mg/dL (ref 70–99)
Potassium: 3.9 mmol/L (ref 3.5–5.1)
Sodium: 139 mmol/L (ref 135–145)

## 2021-01-11 NOTE — Progress Notes (Signed)
Patient information reviewed and entered into [facility] by Becky Paden Senger, PPS coordinator. Information including medical coding, function ability, and quality indicators will be reviewed and updated through discharge.   

## 2021-01-11 NOTE — Progress Notes (Signed)
Patient ID: Robert Good, male   DOB: 12/13/1990, 29 y.o.   MRN: [id number] Met with the patient to review role of the nurse CM and review educational needs. Patient concerned about right UE flaccidity. Nurse reports steri strips to axillary area right arm and light dressing on right wrist area. Patient will need assistance to don/doff CAM boot and complete most activies (right handed); learning to do with the left arm. Also reviewed Lovenox injections; reports significant other to assist as needed. Continue to follow along to discharge to address education.  Sharp, Deborah B  
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
## 2021-01-11 NOTE — Discharge Instructions (Addendum)
Inpatient Rehab Discharge Instructions  Robert Good Discharge date and time:    Activities/Precautions/ Functional Status: Activity: no lifting, driving, or strenuous exercise for till cleared by MD Diet: regular diet Wound Care:     Functional status:  ___ No restrictions     ___ Walk up steps independently ___ 24/7 supervision/assistance   ___ Walk up steps with assistance ___ Intermittent supervision/assistance  ___ Bathe/dress independently ___ Walk with walker     ___ Bathe/dress with assistance ___ Walk Independently    ___ Shower independently ___ Walk with assistance    ___ Shower with assistance ___ No alcohol     ___ Return to work/school ________  Special Instructions: Touch down weight on left leg with CAM boot. Will need to follow up with Vascular surgeon at [facility] for brachial plexus injury.       COMMUNITY REFERRALS UPON DISCHARGE:    Home Health:   PT & OT                Agency: [facility] Phone: [phone]   Medical Equipment/Items Ordered:WHEELCHAIR, LBQC, 3 IN 1 AND TUB BENCH                                                 Agency/Supplier:[facility] ARRANGING TO BE DELIVERED TO PATIENT'S HOME  [facility] CONTACT Dirk [facility] [phone]   My questions have been answered and I understand these instructions. I will adhere to these goals and the provided educational materials after my discharge from the hospital.  Patient/Caregiver Signature _______________________________ Date __________  Clinician Signature _______________________________________ Date __________  Please bring this form and your medication list with you to all your follow-up doctor's appointments. sd

## 2021-01-11 NOTE — IPOC Note (Signed)
Overall Plan of Care [facility]) Patient Details Name: Robert Good MRN: [id number] DOB: [birth year]  Admitting Diagnosis: Brachial plexus injury, right, polytrauma  Hospital Problems: Principal Problem:   Brachial plexus injury, right Active Problems:   Hip fracture (HCC)     Functional Problem List: Nursing Bladder,Bowel,Pain,Endurance,Skin Integrity,Medication Management,Safety,Edema  PT Balance,Edema,Endurance,Motor,Pain,Perception,Safety,Sensory,Skin Integrity  OT Balance,Motor,Pain,Endurance,Skin Integrity,Sensory  SLP    TR         Basic ADL's: OT Eating,Grooming,Bathing,Dressing,Toileting     Advanced  ADL's: OT Simple Meal Preparation     Transfers: PT Bed Mobility,Bed to Chair,Car,Furniture  OT Toilet,Tub/Shower     Locomotion: PT Ambulation,Wheelchair Mobility,Stairs     Additional Impairments: OT Fuctional Use of Upper Extremity  SLP        TR      Anticipated Outcomes Item Anticipated Outcome  Self Feeding mod I  Swallowing      Basic self-care  mod I  Toileting  mod I   Bathroom Transfers S  Bowel/Bladder  Manage bowel with mod I assist and bladder with set up assist  Transfers  ModI  Locomotion  spv  Communication     Cognition     Pain  at or below level 4  Safety/Judgment  maintain safety with cues/reminders   Therapy Plan: PT Intensity: Minimum of 1-2 x/day ,45 to 90 minutes PT Frequency: 5 out of 7 days PT Duration Estimated Length of Stay: 1.5-2 weeks OT Intensity: Minimum of 1-2 x/day, 45 to 90 minutes OT Frequency: 5 out of 7 days OT Duration/Estimated Length of Stay: 1.5 to 2 weeks     Due to the current state of emergency, patients may not be receiving their 3-hours of Medicare-mandated therapy.   Team Interventions: Nursing Interventions Disease Management/Prevention,Bladder Management,Medication Management,Pain Management,Bowel Management,Patient/Family Education,Skin Care/Wound Health Net   PT interventions Ambulation/gait training,Discharge planning,Functional mobility training,Psychosocial support,Therapeutic Activities,Visual/perceptual remediation/compensation,Balance/vestibular training,Disease management/prevention,Neuromuscular re-education,Skin care/wound Environmental consultant propulsion/positioning,DME/adaptive equipment instruction,Cognitive remediation/compensation,Pain management,Splinting/orthotics,UE/LE Strength taining/ROM,Community reintegration,Functional electrical stimulation,Patient/family education,Stair training,UE/LE Coordination activities  OT Interventions Disease mangement/prevention,Balance/vestibular training,Community Pension scheme manager stimulation,Neuromuscular re-education,Patient/family education,Self Care/advanced ADL retraining,Splinting/orthotics,Therapeutic Exercise,UE/LE Coordination activities,Wheelchair propulsion/positioning,UE/LE Strength taining/ROM,Therapeutic Activities,Skin care/wound managment,Functional mobility training,DME/adaptive equipment instruction,Discharge planning,Pain management,Psychosocial support  SLP Interventions    TR Interventions    SW/CM Interventions Discharge Planning,Psychosocial Support,Patient/Family Education   Barriers to Discharge MD  Medical stability  Nursing Decreased caregiver support,Home environment access/layout,Weight bearing restrictions 2 level 1 step entry with 1/2 bath main and 14 step up to bedroom right rail with g'friend who works  PT Inaccessible home environment,Decreased caregiver support,Weight bearing restrictions    OT      SLP      SW Decreased caregiver support,Weight bearing restrictions [name] works during the day   Team Discharge Planning: Destination: PT-Home ,OT- Home , SLP-  Projected Follow-up: PT-Home health PT, OT-  Outpatient OT, SLP-  Projected Equipment Needs: PT-To be determined, OT- To be determined,Tub/shower bench, SLP-   Equipment Details: PT- , OT-  Patient/family involved in discharge planning: PT- Patient,  OT-Patient, SLP-   MD ELOS: 10-14 days Medical Rehab Prognosis:  Excellent Assessment: The patient has been admitted for CIR therapies with the diagnosis of severe right brachial plexus injury with LLE frx's. The team will be addressing functional mobility, strength, stamina, balance, safety, adaptive techniques and equipment, self-care, bowel and bladder mgt, patient and caregiver education, WB precautions, pain mgt, Goals will be supervision with self-care and basic transfers, w/c mobility. Ambulation will be limited until he can bear weight on the LLE.  Due to the current state of emergency, patients may not be receiving their 3 hours per day of Medicare-mandated therapy.    Meredith Staggers, MD, FAAPMR      See Team Conference Notes for weekly updates to the plan of care

## 2021-01-11 NOTE — Progress Notes (Signed)
Physical Therapy Session Note  Patient Details  Name: Robert Good MRN: [id number] Date of Birth: [birth year]  Today's Date: 01/11/2021 PT Individual Time:Session1: 5400-8676; Session2: 1100-1200 PT Individual Time Calculation (min): 57 min & 60 min   Short Term Goals: Week 1:  PT Short Term Goal 1 (Week 1): Patient will tolerate sitting in wc between therapies for at least 1 hour PT Short Term Goal 2 (Week 1): Patient will complete sit <> stand with LRAD and no more than CGA consistently PT Short Term Goal 3 (Week 1): Patient will transfer bed <> wc with LRAD and no more than CGA consistently PT Short Term Goal 4 (Week 1): Patient will ambulate at least 73ft with LRAD and no more than MinA  Skilled Therapeutic Interventions/Progress Updates:    Session1:  Patient in supine and reports doing okay.  Slow moving initially.  Supine to sit with S and increased time with some difficulty lowering L LE, but eventually able to perform unaided.  Donned sling and camboot with total A at EOB. Patient Sit to stand with CGA using HW and ambulated to bathroom partway till noted not able to keep TDWB on L LE so used w/c with assist.  Patient standing with grabbar to toilet with distant S.  In w/c assisted to sink.  Patient washing hand on L at sink with S.  Patient propelled w/c x 150' to therapy gym.  Performed squat pivot to mat with close S and mod cues and assist for w/c set up.  Patient with support under R elbow to attempt weight bearing and possibly lifting up from sitting with L UE and R elbow.  Noted some latissimus contraction on R, but pt unable to effectively weight bear safely and noted when not assisted pt with subluxation at shoulder.  Did attempt standing at eva walker to attempt some weight bearing on R but noted pt unable to feel or control during weight shifts.  Discussed with pt unable to effectively use R UE for weight bearing at this time so plan to use HW for transfers only.  Patient in  agreement.  Demonstrated stair negotiation using shower seat and pt performed with min A and mod cues with increased time to manage L LE around railings, etc.  Patient with c/o pain L knee with flexion during management around railings.  Propelled in w/c to ortho gym.  Performed car transfer with sit to stand and using HW with min A to simulated small SUV height.  Patient lifting and lowering legs from car.  Patient propelled in w/c back to room.  Squat pivot to bed after initial assist for w/c set up with some education provided on legrest management and armrest movement.  Patient left in supine with all needs in reach and bed alarm active.  Session2:  Patient in supine and performed therex in bed as follows: SLR with cues for knee control on L. SAQ on L with 5 sec holds, hip adductor squeezes with 5 sec holds, hip abduction/adduction x 10, ankle pumps x 20, and heel slides.  Patient supine to sit with S and transferred to w/c with CGA after assist to don camboot on L and sling on R and pt donned R shoe on his own.  Patient able to put on legrest after cues.  Propelled in w/c to ortho gym with S.  Sit to stand to [facility] wth CGA and tossed horseshoes reaching to retrieve them and then toss with CGA 2 x about 5  minutes.  Patient propelled in w/c to ADL apartmtent and practiced propelling on carpet, in small spaces and transfer to couch with S and cues for w/c set up.  Patient transfer back to w/c with S.  Propelled w/c to room with S and transferred to bed with cues for w/c set up and S.  Sit to supine with S.  Left with call bell/needs in reach, NT in the room.   Therapy Documentation Precautions:  Precautions Precautions: Fall Precaution Comments: no active movement/sensation in RUE 2/2 brachial plexus injury Required Braces or Orthoses: Sling Restrictions Weight Bearing Restrictions: Yes RUE Weight Bearing: Weight bearing as tolerated LLE Weight Bearing: Touchdown weight bearing Other Position/Activity  Restrictions: CAM walker boot on LLE when OOB Pain: Pain Assessment Pain Score: 10-Worst pain ever Pain Type: Acute pain Pain Location: Hand Pain Orientation: Right Pain Descriptors / Indicators: Sharp;Shooting;Pins and needles Pain Onset: On-going Pain Intervention(s): RN made aware   Therapy/Group: Individual Therapy  Elray Mcgregor  Bache, PT 01/11/2021, 12:09 PM

## 2021-01-11 NOTE — Progress Notes (Addendum)
PROGRESS NOTE   Subjective/Complaints: Up with therapy in gym. Right arm with intermittent shooting pain into the hand. Has noticed improved gross sensation in his forearm and hand the last 24 hours. Wearing sling. Says that pain is tolerable.  ROS: Patient denies fever, rash, sore throat, blurred vision, nausea, vomiting, diarrhea, cough, shortness of breath or chest pain,  headache, or mood change.   Objective:   No results found. Recent Labs    01/09/21 0511 01/11/21 0716  WBC 5.8 4.1  HGB 11.0* 11.1*  HCT 33.0* 33.8*  PLT 773* 736*   Recent Labs    01/09/21 0511 01/11/21 0716  NA 134* 139  K 4.1 3.9  CL 100 101  CO2 27 32  GLUCOSE 104* 99  BUN 18 18  CREATININE 0.92 1.03  CALCIUM 9.6 9.6    Intake/Output Summary (Last 24 hours) at 01/11/2021 1201 Last data filed at 01/11/2021 0750 Gross per 24 hour  Intake 358 ml  Output --  Net 358 ml        Physical Exam: Vital Signs Blood pressure 118/74, pulse 74, temperature 98.2 F (36.8 C), resp. rate 18, weight 59.3 kg, SpO2 100 %. Constitutional: No distress . Vital signs reviewed. HEENT: EOMI, oral membranes moist Neck: supple Cardiovascular: RRR without murmur. No JVD    Respiratory/Chest: CTA Bilaterally without wheezes or rales. Normal effort    GI/Abdomen: BS +, non-tender, non-distended Ext: no clubbing, cyanosis, or edema Psych: pleasant and cooperative Musculoskeletal:  right neck tightness Comments: Left posterior thigh incision and abrasions on left shin with sutures intact-cam boot on. RUE forearm lacerations/abrasions with scabbed over sutures. Area in right axilla with steristrips. Other areas closed, well-approximated Skin: General: Skin is warm.  Comments: See above Neurological:  Mental Status: He is alertand oriented to person, place, and time.  Cranial Nerves: No cranial nerve deficit.  Comments: Speech clear  and able to follow commands without difficulty. RUE still only with trace shoulder movement, otherwise 0/5. LUE 5/5. RLE 4/5. LLE 3 to 4/5 with pain inhibition. Sensation 0/2 RUE to LT middle and distally. Does sense gross touch, has dysesthesias more proximally in arm. Deltoid area with even more sensation present. DTR's absent RUE  entnormal.  Judgment: Judgmentnormal.    Assessment/Plan: 1. Functional deficits which require 3+ hours per day of interdisciplinary therapy in a comprehensive inpatient rehab setting.  Physiatrist is providing close team supervision and 24 hour management of active medical problems listed below.  Physiatrist and rehab team continue to assess barriers to discharge/monitor patient progress toward functional and medical goals  Care Tool:  Bathing    Body parts bathed by patient: Right arm,Chest,Abdomen,Front perineal area,Right upper leg,Buttocks,Left upper leg,Right lower leg,Left lower leg,Face   Body parts bathed by helper: Left arm     Bathing assist Assist Level: Minimal Assistance - Patient > 75%     Upper Body Dressing/Undressing Upper body dressing Upper body dressing/undressing activity did not occur (including orthotics): Safety/medical concerns What is the patient wearing?: Pull over shirt    Upper body assist Assist Level: Maximal Assistance - Patient 25 - 49%    Lower Body Dressing/Undressing Lower body dressing  What is the patient wearing?: Pants,Underwear/pull up     Lower body assist Assist for lower body dressing: Minimal Assistance - Patient > 75%     Toileting Toileting    Toileting assist Assist for toileting: Minimal Assistance - Patient > 75% Assistive Device Comment: urinal   Transfers Chair/bed transfer  Transfers assist     Chair/bed transfer assist level: Minimal Assistance - Patient > 75%     Locomotion Ambulation   Ambulation assist   Ambulation activity did not occur: Safety/medical  concerns (no platform for R UE with orders to wb thru elbow only)  Assist level: 2 helpers (MinA + wc follow) Assistive device: Walker-hemi     Walk 10 feet activity   Assist  Walk 10 feet activity did not occur: Safety/medical concerns (no platform for R UE with orders to wb thru elbow only)  Assist level: 2 helpers (MinA + wc follow) Assistive device: Walker-hemi   Walk 50 feet activity   Assist Walk 50 feet with 2 turns activity did not occur: Safety/medical concerns (no platform for R UE with orders to wb thru elbow only)         Walk 150 feet activity   Assist Walk 150 feet activity did not occur: Safety/medical concerns (no platform for R UE with orders to wb thru elbow only)         Walk 10 feet on uneven surface  activity   Assist Walk 10 feet on uneven surfaces activity did not occur: Safety/medical concerns         Wheelchair     Assist Will patient use wheelchair at discharge?: Yes (TBD) Type of Wheelchair: Manual    Wheelchair assist level: Supervision/Verbal cueing Max wheelchair distance: 150    Wheelchair 50 feet with 2 turns activity    Assist        Assist Level: Supervision/Verbal cueing   Wheelchair 150 feet activity     Assist      Assist Level: Supervision/Verbal cueing   Blood pressure 118/74, pulse 74, temperature 98.2 F (36.8 C), resp. rate 18, weight 59.3 kg, SpO2 100 %.  Medical Problem List and Plan: 1.Functional and mobility deficitssecondary to polytrauma and severe right brachial plexus injury -patient may shower -ELOS/Goals: 21-24 days, supervision to min assist goals with PT, OT  -Continue CIR therapies including PT, OT  2. Impaired mobility -DVT/anticoagulation:Mechanical:Antiembolism stockings, knee (TED hose) Bilateral lower extremities. Vascular study reviewed and is negative -antiplatelet therapy: ASA 3. Right brachial plexus injury, myofascial  pain:Oxycodone prn -continue gabapentin to 600mg  TID -aspercreme to shoulder, neck -sling for support  -added kpad -ROM, desensitizing activities in bed, with therapy  6/6 pain seems tolerable 4. Mood:LCSW to follow for evaluation and support. -antipsychotic agents: N/A 5. Neuropsych: This patientiscapable of making decisions on hisown behalf. 6. Skin/Wound Care:Routine pressure relief measures. --monitor incisions for healing.  -Continue SUTURES in LLE 7. Hyponatremia: 139 6/6 8. Vitamin D Deficiency: On weekly supplement.  9. ABLA: Stable post transfusion: hgb improved to 11 on 6/4, monitor weekly.  10. Dizziness: Has history of vertigo.  --acclimation  -ortho vs negative.  11. Left femoral shaft fx open, left tib-fib fx  -TTWB LLE with CAM boot 12. Right acromium fx  -WBAT RUE    LOS: 3 days A FACE TO FACE EVALUATION WAS PERFORMED  8/4 01/11/2021, 12:01 PM

## 2021-01-11 NOTE — Progress Notes (Signed)
Occupational Therapy Session Note  Patient Details  Name: Robert Good MRN: [id number] Date of Birth: July 10, 1991  Today's Date: 01/11/2021 OT Individual Time: 3419-6222 OT Individual Time Calculation (min): 80 min    Short Term Goals: Week 1:  OT Short Term Goal 1 (Week 1): Pt will complete toilet transfer with CGA. OT Short Term Goal 2 (Week 1): Pt will tolerate standing in prep for standing ADL >5 min with CGA. OT Short Term Goal 3 (Week 1): Pt will manage RUE during functional mobility with ind.  Skilled Therapeutic Interventions/Progress Updates:    Patient in bed, alert but tired, he notes pain in right shoulder/hand at "8"  He declined pain medication as offered by nursing at this time stating that mobility with current therapy session effective.  Completed right UE PROM/AAROM in supine and sitting positions.  Towel slides with right - educated in trunk / positioning compensation and how to isolate muscle groups.  Completed posture and core mobility activities to include trials with weight bearing through elbow and forearm.  Reviewed positioning for shoulder to hand in supine, sitting and when using sling.  Provided input regarding subluxation and how to determine effectiveness of support.  Reviewed and practiced desensitization techniques for hand / forearm with good understanding demonstrated.  Reviewed isometric exercises that he can complete / trial on his own.  Message to MD regarding splint for right wrist support during activity.  Supine to/from sitting edge of bed with CS and cues for supporting right shoulder.   Good seated balance for activity.  He returned to supine position in bed at close of session, call bell in hand and bed alarm set.    Therapy Documentation Precautions:  Precautions Precautions: Fall Precaution Comments: no active movement/sensation in RUE 2/2 brachial plexus injury Required Braces or Orthoses: Sling Restrictions Weight Bearing Restrictions:  Yes RUE Weight Bearing: Weight bearing as tolerated LLE Weight Bearing: Touchdown weight bearing Other Position/Activity Restrictions: CAM walker boot on LLE when OOB  Therapy/Group: Individual Therapy  Barrie Lyme 01/11/2021, 7:42 AM

## 2021-01-11 NOTE — Progress Notes (Signed)
[facility]patient [facility] Individual Statement of Services  Patient Name:  Robert Good  Date:  01/11/2021  Welcome to the Inpatient Rehabilitation [facility].  Our goal is to provide you with an individualized program based on your diagnosis and situation, designed to meet your specific needs.  With this comprehensive rehabilitation program, you will be expected to participate in at least 3 hours of rehabilitation therapies Monday-Friday, with modified therapy programming on the weekends.  Your rehabilitation program will include the following services:  Physical Therapy (PT), Occupational Therapy (OT), 24 hour per day rehabilitation nursing, Care Coordinator, Rehabilitation Medicine, Nutrition Services and Pharmacy Services  Weekly team conferences will be held on Tuesday to discuss your progress.  Your Inpatient Rehabilitation Care Coordinator will talk with you frequently to get your input and to update you on team discussions.  Team conferences with you and your family in attendance may also be held.  Expected length of stay: 1.5-2 weeks  Overall anticipated outcome: supervision level  Depending on your progress and recovery, your program may change. Your Inpatient Rehabilitation Care Coordinator will coordinate services and will keep you informed of any changes. Your Inpatient Rehabilitation Care Coordinator's name and contact numbers are listed  below.  The following services may also be recommended but are not provided by the Inpatient Rehabilitation Center:   Driving Evaluations  Home Health Rehabiltiation Services  Outpatient Rehabilitation Services  Vocational Rehabilitation   Arrangements will be made to provide these services after discharge if needed.  Arrangements include referral to agencies that provide these services.  Your insurance has been verified to be:  [employer] Your primary doctor is:  None  Pertinent information will be shared with your doctor and your  insurance company.  Inpatient Rehabilitation Care Coordinator:  Dossie Der, [place] [phone] or Luna Glasgow  Information discussed with and copy given to patient by: Lucy Chris, 01/11/2021, 9:56 AM

## 2021-01-12 DIAGNOSIS — I6389 Other cerebral infarction: Secondary | ICD-10-CM

## 2021-01-12 MED ORDER — GABAPENTIN 400 MG PO CAPS
800.0000 mg | ORAL_CAPSULE | Freq: Three times a day (TID) | ORAL | Status: DC
  Administered 2021-01-12 – 2021-01-19 (×21): 800 mg via ORAL
  Filled 2021-01-12 (×21): qty 2

## 2021-01-12 MED ORDER — LIDOCAINE 5 % EX PTCH
2.0000 | MEDICATED_PATCH | CUTANEOUS | Status: DC
  Administered 2021-01-13 – 2021-01-14 (×2): 2 via TRANSDERMAL
  Filled 2021-01-12 (×2): qty 2

## 2021-01-12 MED ORDER — LIDOCAINE 5 % EX PTCH
1.0000 | MEDICATED_PATCH | Freq: Once | CUTANEOUS | Status: AC
  Administered 2021-01-12: 1 via TRANSDERMAL
  Filled 2021-01-12: qty 1

## 2021-01-12 NOTE — Progress Notes (Signed)
Physical Therapy Session Note  Patient Details  Name: Robert Good MRN: [id number] Date of Birth: 01/16/1991  Today's Date: 01/12/2021 PT Individual Time:Session1: 1030-1100; Session2: 1415-1500 PT Individual Time Calculation (min): 30 min & 45 min  Short Term Goals: Week 1:  PT Short Term Goal 1 (Week 1): Patient will tolerate sitting in wc between therapies for at least 1 hour PT Short Term Goal 2 (Week 1): Patient will complete sit <> stand with LRAD and no more than CGA consistently PT Short Term Goal 3 (Week 1): Patient will transfer bed <> wc with LRAD and no more than CGA consistently PT Short Term Goal 4 (Week 1): Patient will ambulate at least 28ft with LRAD and no more than MinA  Skilled Therapeutic Interventions/Progress Updates:    Session1: Patient in supine and noted tape on shoulder reports feels better, but now with some R rib pain he did not have before.  Patient performed bed exercises consisting of: L heel slides, hip abduction, hip adductor squeezes with quat set bilateral, R single limb bridge with L leg lift, SLR, and SAQ all x 10.  Patient supine to sit with S.  Working at EOB to [employer] with cues and close S increased time.  Doffed camboot with S.  Sit to supine S and left with call bell in reach and bed alarm active.  Session2: Patient in supine and performed supine to sit with S.  Patient at EOB assisted for camboot and to initiate sling.  Donned R shoe with assist to tie laces only.  Patient transferred to w/c with S squat pivot.  Orthotist in to fit for L wrist cock up splint and OT in to ensure correct splint for pt.  Patient propelled w/c to therapy gym x 150'.  Demonstrated quad cane versus hemiwalker for transfers only and pt performed with [facility] with S to CGA and cues for technique.  Reports cane feels better and less bulky.  Patient practiced bumping up on 4" step on mat behind him and performed with S.  Then transfer from mat to chair on mat behind him  with min A and cues.  Performed stair negotiation up/down 3 bumping up on bottom with close S, then up to chair on landing with min A.  Patient reports this is his plan for up steps at home and feels confident he can do it.  Propelled to room in w/c and left up in chair with OT session imminent.    Therapy Documentation Precautions:  Precautions Precautions: Fall Precaution Comments: no active movement/sensation in RUE 2/2 brachial plexus injury Required Braces or Orthoses: Sling Restrictions Weight Bearing Restrictions: Yes RUE Weight Bearing: Weight bearing as tolerated LLE Weight Bearing: Touchdown weight bearing Other Position/Activity Restrictions: CAM walker boot on LLE when OOB Pain: Pain Assessment Pain Scale: 0-10 Pain Score: 9  Pain Location: Arm Pain Orientation: Right Pain Descriptors / Indicators: Sharp;Shooting Pain Frequency: Intermittent Pain Intervention(s): Medication (See eMAR)   Therapy/Group: Individual Therapy  Elray Mcgregor  Sheran Lawless, PT 01/12/2021, 4:51 PM

## 2021-01-12 NOTE — Progress Notes (Signed)
Occupational Therapy Session Note  Patient Details  Name: Robert Good MRN: [id number] Date of Birth: Aug 27, 1990  Today's Date: 01/12/2021 OT Individual Time: 0900-1000   &   1500-1545 OT Individual Time Calculation (min): 60 min    &  45 min   Short Term Goals: Week 1:  OT Short Term Goal 1 (Week 1): Pt will complete toilet transfer with CGA. OT Short Term Goal 2 (Week 1): Pt will tolerate standing in prep for standing ADL >5 min with CGA. OT Short Term Goal 3 (Week 1): Pt will manage RUE during functional mobility with ind.  Skilled Therapeutic Interventions/Progress Updates:    AM session:   Patient in bed, alert and ready for therapy session.   He notes that pain is under control but more pronounced at right rib cage at this time.  He is able to demonstrate good recall of strategies for shoulder joint protection.  He reports that his girlfriend was visiting this am and assisted with shower and change of clothing.  Supine to sitting edge of bed with CS and increased time - poor tolerance of flat bed.  Reviewed options for elevating HOB with wedge vs need for hospital bed post discharge.  Applied kinesiotape to right shoulder for facilitation of deltoid, rhomboids, support of humeral head and scapular depression.  Reviewed skin care and use of kinesiotape with good understanding demonstrated.  Max A to donn CAM boot and right sneaker.  Sit pivot transfer bed to w/c with CGA/CS.  He is able to propel w/c to/from therapy treatment spaces.  Reviewed and practiced tub bench and commode - discussed mobility and DME options for safe bathroom access.  He demonstrated ability to pivot to/from shower bench with CS and min cues for set up and hand placement.  Returned to bed at close of session with CS.  He is able to [employer] sneaker and donn slipper sock with set up.  Sit to supine with CS.  Completed scapular mobility and attempted shoulder AAROM/isometrics.  He remained in bed at close of session, bed  alarm set and call bell in hand.     PM session:   Patient seated in w/c, reviewed wrist splint that arrived earlier today - patient notes some discomfort with fit although it does provide wrist support within sling.  Discussed options for FES, may trial later this week.  Reviewed sling options.  Sit pivot transfer w/c to bed with CS.   Able to tolerate unsupported sitting at edge of bed with focus on shoulder positioning and closed chain exercises to promote shoulder flexion, extension, abd, add, ER and IR using skateboard and UE ranger (with manual elbow support)   Returned to supine position in bed at close of session, bed alarm set and call bell in hand.       Therapy Documentation Precautions:  Precautions Precautions: Fall Precaution Comments: no active movement/sensation in RUE 2/2 brachial plexus injury Required Braces or Orthoses: Sling Restrictions Weight Bearing Restrictions: Yes RUE Weight Bearing: Weight bearing as tolerated LLE Weight Bearing: Touchdown weight bearing Other Position/Activity Restrictions: CAM walker boot on LLE when OOB   Therapy/Group: Individual Therapy  Barrie Lyme 01/12/2021, 7:42 AM
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
## 2021-01-12 NOTE — Progress Notes (Signed)
Orthopedic Tech Progress Note Patient Details:  Robert Good 1991/04/29 [id number] Called In order to [facility] for a COCK UP WRIST SPLINT Patient ID: [name], male   DOB: 03/14/1991, 30 y.o.   MRN: [id number]   [name] 01/12/2021, 10:06 AM
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
## 2021-01-13 NOTE — Progress Notes (Addendum)
Patient ID: Robert Good, male   DOB: 1990-09-17, 30 y.o.   MRN: [id number] Met with pt and spoke with girlfriend-Robert Good via telephone to update team conference goals of supervision level and target discharged date of 6/14. Have contacted [name] with [facility] to begin working on equipment and follow up needs. Await her return call. Robert Good plans on taking a week off to be there with him for transition home and then his parents may come for another week. Continue work on discharge needs.  2;25 PM have faxed orders for equiqment and home health to [phone] [facility]

## 2021-01-13 NOTE — Progress Notes (Signed)
Occupational Therapy Session Note  Patient Details  Name: Cordarryl Monrreal MRN: [id number] Date of Birth: Jul 24, 1991  Today's Date: 01/13/2021 OT Individual Time: 8466-5993 OT Individual Time Calculation (min): 72 min    Short Term Goals: Week 1:  OT Short Term Goal 1 (Week 1): Pt will complete toilet transfer with CGA. OT Short Term Goal 2 (Week 1): Pt will tolerate standing in prep for standing ADL >5 min with CGA. OT Short Term Goal 3 (Week 1): Pt will manage RUE during functional mobility with ind.  Skilled Therapeutic Interventions/Progress Updates:    Pt received supine in bed, agreeable to OT, and reporting fatigue and increased pain in RUE (5/10), but stated it intermittently would get worse multiple times throughout day and be 10/10 pain. RN provided meds during start of session. Pt education on AE for BADLs. Sup>sit close spvsn. Pt donned LLE cam boot and RUE sling with close spvsn seated EOB. Donned R nonskid sock close spvsn and shoe min A to tie. Pt education on adaptive techniques for donning sneakers and offered AE, but pt able to don when shoe is left tied. Squat pivot transfers throughout session at close spvsn and no AD. Pt self-propelled w/c from room to apartment. Education on AE for simple meal prep and safe navigation/techniques for the kitchen with pt demoing good understanding. Pt engaged in dynamic standing activity no AD using countertops for balance to reach high and low cabinets; no LOB. Pt hopped while following TTWB precautions for LLE using counter tops forward and backward ~10' with min vc's. Pt demo'd good safety throughout task and stood ~5 min before requiring seated rest break. Pt had increased pain in RUE (see below); pt education on pain relief/coping strategies including diaphragmatic breathing and 5 senses grounding technique. Pt requested LLE stretches/exercises reporting too fatigued and in pain to complete further RUE isometric exercises. On therapy mat  using wedge because difficult to tolerate complete supine position, pt engaged in LLE exercises including hip abduction/adduction, FLX/EXT to tolerance (maxi move under LLE to decrease friction). Pt remained supine, positioned with pillows for RUE subluxation prevention, alarm set, and all needs met.   Therapy Documentation Precautions:  Precautions Precautions: Fall Precaution Comments: no active movement/sensation in RUE 2/2 brachial plexus injury Required Braces or Orthoses: Sling Restrictions Weight Bearing Restrictions: Yes RUE Weight Bearing: Weight bearing as tolerated LLE Weight Bearing: Touchdown weight bearing Other Position/Activity Restrictions: CAM walker boot on LLE when OOB Pain: Pain Assessment Pain Scale: 0-10 Pain Score: 10-Worst pain ever Pain Type: Acute pain Pain Location: Arm Pain Orientation: Right Pain Descriptors / Indicators: Burning;Sharp;Shooting;Throbbing Pain Frequency: Intermittent Pain Onset: On-going Pain Intervention(s): Repositioned;Distraction;Rest;Relaxation;Emotional support   Therapy/Group: Individual Therapy  Mellissa Kohut 01/13/2021, 4:34 PM

## 2021-01-13 NOTE — Progress Notes (Signed)
Physical Therapy Session Note  Patient Details  Name: Robert Good MRN: [id number] Date of Birth: 15-Apr-1991  Today's Date: 01/13/2021 PT Individual Time:Session1: 0937-1000; Session2: 1300-1400 PT Individual Time Calculation (min): 23 min & 60 min  Short Term Goals: Week 1:  PT Short Term Goal 1 (Week 1): Patient will tolerate sitting in wc between therapies for at least 1 hour PT Short Term Goal 2 (Week 1): Patient will complete sit <> stand with LRAD and no more than CGA consistently PT Short Term Goal 3 (Week 1): Patient will transfer bed <> wc with LRAD and no more than CGA consistently PT Short Term Goal 4 (Week 1): Patient will ambulate at least 21ft with LRAD and no more than MinA  Skilled Therapeutic Interventions/Progress Updates:    Session1: Patient in supine and reports increased pain in R arm.  Agreeable to in bed therex.  Patient performed SAQ x 10 each leg with 5 sec hold, hip adductor squeezes with quad set x 10 w/ 5 sec hold, AP's x 20, hooklying hip abduction with blue t-band x 15, single limb bridge x 7 with L LE SLR.  Performed patellar mobs on L LE med/lat and sup/inf.  Then performed gentle scar mobilization over L patella and lateral aspect of knee.  Education throughout on appropriate level of stress as tissues still healing.  Patient left in supine with call bell in reach and bed alarm active.  Session2: Patient in supine and reports slight improvement in pain, but concerned about blood flow since the vascular reconstruction.  Felt hand was warm and nailbed color matches the left hand.  Also felt strong radial pulse so encouraged pt blood flow was present.  Patient supine to sit with S and increased time to manage R UE.  Seated EOB to don wrist splint, sling, camboot and pt donned shoe on R foot with S.  Patient transferred to w/c with S after set up.  Propelled w/c to ortho gym x 200' with S using L UE and R LE.  Sit to stand at [facility] to perform dynamic standing  balance task tapping targets in sequence for numbers then letters with S to CGA while keeping TDWB on L taking 5 minutes to complete.  Patient standing for tapping circles over middle part of screen then tapping center letter when changing with S 3 minute timed.  Patient propelled w/c to elevator with S.  Assisted down elevated to first floor then to [facility].  Patient propelled over uneven paved outdoor surfaces x 400' with S, taking 1 rest break.  Patient assisted to therapy gym in w/c.  Performed counter standing L hip abduction, extension 2 x 10 reps.  Standing hip flexor stretch 3 x 20 sec hold with L knee on stool with assist.  Patient in w/c propelled to room.  Transfer to bed with S, removed sling, wrist brace, cam boot and shoe and left in supine with call bell and needs in reach and bed alarm active.   Therapy Documentation Precautions:  Precautions Precautions: Fall Precaution Comments: no active movement/sensation in RUE 2/2 brachial plexus injury Required Braces or Orthoses: Sling Restrictions Weight Bearing Restrictions: Yes RUE Weight Bearing: Weight bearing as tolerated LLE Weight Bearing: Touchdown weight bearing Other Position/Activity Restrictions: CAM walker boot on LLE when OOB Pain: Pain Assessment Pain Scale: 0-10 Pain Score: 10-Worst pain ever Pain Type: Acute pain Pain Location: Arm Pain Orientation: Right Pain Descriptors / Indicators: Burning;Sharp;Shooting Pain Intervention(s): Medication (  See eMAR)   Therapy/Group: Individual Therapy  Elray Mcgregor  [place], PT 01/13/2021, 8:28 AM
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
## 2021-01-13 NOTE — Plan of Care (Signed)
  Problem: RH Ambulation Goal: LTG Patient will ambulate in controlled environment (PT) Description: LTG: Patient will ambulate in a controlled environment, # of feet with assistance (PT). Outcome: Not Applicable Flowsheets (Taken 01/13/2021 1607) LTG: Pt will ambulate in controlled environ  assist needed:: (goal discontinued due to unable to keep TDWB on L due to R UE weakness) -- Goal: LTG Patient will ambulate in home environment (PT) Description: LTG: Patient will ambulate in home environment, # of feet with assistance (PT). Outcome: Not Applicable Flowsheets (Taken 01/13/2021 1607) LTG: Pt will ambulate in home environ  assist needed:: (goial discontinued due to not able to keep TDWB on L due to R UE weakness) --  Ambulation goals discontinued due to unable to maintain TDWB due to R weakness Sheran Lawless, PT

## 2021-01-13 NOTE — Progress Notes (Signed)
Occupational Therapy Session Note  Patient Details  Name: Robert Good MRN: [id number] Date of Birth: [birth year]  Today's Date: 01/13/2021 OT Individual Time: 1100-1200 OT Individual Time Calculation (min): 60 min    Short Term Goals: Week 1:  OT Short Term Goal 1 (Week 1): Pt will complete toilet transfer with CGA. OT Short Term Goal 2 (Week 1): Pt will tolerate standing in prep for standing ADL >5 min with CGA. OT Short Term Goal 3 (Week 1): Pt will manage RUE during functional mobility with ind.  Skilled Therapeutic Interventions/Progress Updates:    Pt in bed, reporting shooting/burning pains in RUE radiating down arm intermittently.  Pt agreeable to working with OT this session.  Pt completed supine to sit at EOB with supervision for assessment strength in right arm detailed noting 2-/5 in periscapular musculature including shoulder elevation, depression, retraction, and protraction.  Trace noted in shoulder extensors. Questionnable trace movements in biceps/triceps. 0/5 MMT all forearm, wrist, hand.  Also noted asymmetry right scapula compared to left with winging of inferior angle and superior upward rotated resting position due to weakness in surrounding musculature.    Instructed pt in AROM shoulder retraction/protraction, elevation/depression, and shoulder extension while providing tactile cues to reduce compensatory movements and isolate desired muscle contraction.  Pt completed 3 x 12 reps of each.  Instructed pt on HEP to complete these exercises 3 times per day.   1:1 NMES applied to biceps and triceps to facilitate strengthening and neuro re-ed for improved functional use of right dominant arm. Ratio 1:3 Rate 35 pps Waveform- Asymmetric Ramp 1.0 Pulse 300 Intensity- 5 Duration - 10   No adverse reactions after treatment and is skin intact.   Therapy Documentation Precautions:  Precautions Precautions: Fall Precaution Comments: no active movement/sensation in  RUE 2/2 brachial plexus injury Required Braces or Orthoses: Sling Restrictions Weight Bearing Restrictions: Yes RUE Weight Bearing: Weight bearing as tolerated LLE Weight Bearing: Touchdown weight bearing Other Position/Activity Restrictions: CAM walker boot on LLE when OOB   Therapy/Group: Individual Therapy  Amie Critchley 01/13/2021, 3:26 PM

## 2021-01-14 ENCOUNTER — Inpatient Hospital Stay (HOSPITAL_COMMUNITY): Payer: No Typology Code available for payment source

## 2021-01-14 MED ORDER — LIDOCAINE 5 % EX PTCH
3.0000 | MEDICATED_PATCH | CUTANEOUS | Status: DC
  Filled 2021-01-14 (×2): qty 3

## 2021-01-14 MED ORDER — FENTANYL 12 MCG/HR TD PT72
1.0000 | MEDICATED_PATCH | TRANSDERMAL | Status: DC
  Administered 2021-01-14 – 2021-01-17 (×2): 1 via TRANSDERMAL
  Filled 2021-01-14 (×2): qty 1
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
## 2021-01-14 NOTE — Progress Notes (Signed)
PROGRESS NOTE   Subjective/Complaints: Pt c/o significant pain in right rib cage. No problems with breathing. Right arm/hand becoming increasingly painful  ROS: Patient denies fever, rash, sore throat, blurred vision, nausea, vomiting, diarrhea, cough, shortness of breath or chest pain,  headache, or mood change. .    Objective:   DG Tibia/Fibula Left  Result Date: 01/14/2021 CLINICAL DATA:  Left tibial and fibular fractures. EXAM: LEFT TIBIA AND FIBULA - 2 VIEW COMPARISON:  Dec 25, 2020. FINDINGS: Status post intramedullary rod fixation of distal left tibial shaft fracture. Stable appearance of comminuted fractures involving the distal left tibia and fibula is noted. IMPRESSION: Status post intramedullary rod fixation of distal left tibial shaft fracture. Stable appearance of comminuted distal left tibial and fibular fractures are noted. Electronically Signed   By: Lupita Raider M.D.   On: 01/14/2021 08:59   DG FEMUR MIN 2 VIEWS LEFT  Result Date: 01/14/2021 CLINICAL DATA:  Left femoral fracture. EXAM: LEFT FEMUR 2 VIEWS COMPARISON:  Dec 25, 2020. FINDINGS: Status post intramedullary rod fixation of comminuted femoral shaft fracture. Alignment of fracture components is unchanged compared to prior exam. Some callus formation is noted around the fracture suggesting some healing, although persistent fracture lines remain. IMPRESSION: Status post intramedullary rod fixation of comminuted left femoral shaft fracture. Some callus formation is noted suggesting some healing. Electronically Signed   By: Lupita Raider M.D.   On: 01/14/2021 08:56   No results for input(s): WBC, HGB, HCT, PLT in the last 72 hours.  No results for input(s): NA, K, CL, CO2, GLUCOSE, BUN, CREATININE, CALCIUM in the last 72 hours.   Intake/Output Summary (Last 24 hours) at 01/14/2021 0917 Last data filed at 01/14/2021 0838 Gross per 24 hour  Intake 720 ml  Output 400  ml  Net 320 ml        Physical Exam: Vital Signs Blood pressure 105/67, pulse 64, temperature 98.2 F (36.8 C), resp. rate 18, height 5\' 8"  (1.727 m), weight 59.3 kg, SpO2 97 %. Constitutional: No distress . Vital signs reviewed. HEENT: EOMI, oral membranes moist Neck: supple Cardiovascular: RRR without murmur. No JVD    Respiratory/Chest: CTA Bilaterally without wheezes or rales. Normal effort    GI/Abdomen: BS +, non-tender, non-distended Ext: no clubbing, cyanosis, or edema Psych: pleasant and cooperative  Musculoskeletal:        right neck tightness, right rib cage tender along lower half, axillary line  Skin: left posterior thigh incision and abrasions on left shin with sutures. Area is dry.. RUE forearm lacerations/abrasions with scab/suture. Area in right axilla with steristrips. All areas clean and dry    Neurological:     Mental Status: He is alert and oriented to person, place, and time. Normal cognition    Cranial Nerves: No cranial nerve deficit.     Comments: Alert and oriented x 3. Normal insight and awareness. Intact Memory. Normal language and speech. Cranial nerve exam unremarkable . RUE still only with trace shoulder movement, otherwise remains 0/5. LUE 5/5. RLE 4/5. LLE 3 to 4/5 with pain inhibition. Sensation 0/2 RUE to LT middle and distally. Does sense gross touch, has dysesthesias more proximally in  arm than distally. No motor/sensory changes today     Assessment/Plan: 1. Functional deficits which require 3+ hours per day of interdisciplinary therapy in a comprehensive inpatient rehab setting. Physiatrist is providing close team supervision and 24 hour management of active medical problems listed below. Physiatrist and rehab team continue to assess barriers to discharge/monitor patient progress toward functional and medical goals  Care Tool:  Bathing    Body parts bathed by patient: Right arm, Chest, Abdomen, Front perineal area, Right upper leg,  Buttocks, Left upper leg, Right lower leg, Left lower leg, Face   Body parts bathed by helper: Left arm     Bathing assist Assist Level: Minimal Assistance - Patient > 75%     Upper Body Dressing/Undressing Upper body dressing Upper body dressing/undressing activity did not occur (including orthotics): Safety/medical concerns What is the patient wearing?: Pull over shirt    Upper body assist Assist Level: Maximal Assistance - Patient 25 - 49%    Lower Body Dressing/Undressing Lower body dressing      What is the patient wearing?: Pants, Underwear/pull up     Lower body assist Assist for lower body dressing: Minimal Assistance - Patient > 75%     Toileting Toileting    Toileting assist Assist for toileting: Supervision/Verbal cueing (standing at toilet) Assistive Device Comment: urinal   Transfers Chair/bed transfer  Transfers assist     Chair/bed transfer assist level: Supervision/Verbal cueing     Locomotion Ambulation   Ambulation assist   Ambulation activity did not occur: Safety/medical concerns (no platform for R UE with orders to wb thru elbow only)  Assist level: Moderate Assistance - Patient 50 - 74% Assistive device: Walker-hemi Max distance: 5   Walk 10 feet activity   Assist  Walk 10 feet activity did not occur: Safety/medical concerns (no platform for R UE with orders to wb thru elbow only)  Assist level: 2 helpers (MinA + wc follow) Assistive device: Walker-hemi   Walk 50 feet activity   Assist Walk 50 feet with 2 turns activity did not occur: Safety/medical concerns (no platform for R UE with orders to wb thru elbow only)         Walk 150 feet activity   Assist Walk 150 feet activity did not occur: Safety/medical concerns (no platform for R UE with orders to wb thru elbow only)         Walk 10 feet on uneven surface  activity   Assist Walk 10 feet on uneven surfaces activity did not occur: Safety/medical concerns          Wheelchair     Assist Will patient use wheelchair at discharge?: Yes Type of Wheelchair: Manual    Wheelchair assist level: Supervision/Verbal cueing Max wheelchair distance: 200    Wheelchair 50 feet with 2 turns activity    Assist        Assist Level: Supervision/Verbal cueing   Wheelchair 150 feet activity     Assist      Assist Level: Supervision/Verbal cueing   Blood pressure 105/67, pulse 64, temperature 98.2 F (36.8 C), resp. rate 18, height 5\' 8"  (1.727 m), weight 59.3 kg, SpO2 97 %.  Medical Problem List and Plan: 1.  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 Impaired mobility -DVT/anticoagulation:  Mechanical:  Antiembolism stockings, knee (TED hose) Bilateral lower extremities. Vascular study reviewed and is negative             -antiplatelet therapy: ASA 3. Right brachial plexus injury, myofascial pain: Oxycodone prn             -increased gabapentin to 800mg  TID 6/7---observe today. Pt aware that we have to be careful when titrating meds.              -aspercreme to shoulder, neck             -sling for support, ordered cock up right wrist splint  -continue kpad             -ROM, desensitizing activities in bed, with therapy  -lidocaine patches for right lower neck as well as right lower ribs, incr to 3  -6/9 add fentanyl patch today to assist with generalized pain control    -reviewed Chest CT--no rib fx's seen, pt without any respiratory c/o, sats good. Will continue to treat chest wall pain. 4. Mood: LCSW to follow for evaluation and support.              -antipsychotic agents: N/A 5. Neuropsych: This patient is capable of making decisions on his own behalf. 6. Skin/Wound Care:  Routine pressure relief measures.              --monitor incisions for healing.              -Continue SUTURES in LLE per ortho 7. Hyponatremia: 139 6/6 8. Vitamin D Deficiency: On weekly supplement.  9. ABLA: Stable post transfusion: hgb improved to 11 on 6/4, monitor weekly.  10. Dizziness: Has history of vertigo.              --acclimation  -ortho vs negative.  11. Left femoral shaft fx open, left tib-fib shaft fx's  -TTWB LLE with CAM boot  -xrays reviewed and demonstrate early healing. 12. Right acromium fx  -WBAT RUE      LOS: 6 days A FACE TO FACE EVALUATION WAS PERFORMED  8/4 01/14/2021, 9:17 AM

## 2021-01-14 NOTE — Progress Notes (Signed)
Orthopedic Tech Progress Note Patient Details:  Robert Good 1990/10/10 [id number]  Ortho Devices Type of Ortho Device: Crutches Ortho Device/Splint Interventions: Ordered      Lovett Calender 01/14/2021, 6:28 PM

## 2021-01-14 NOTE — Progress Notes (Signed)
Patient ID: Robert Good, male   DOB: [birth year], 30 y.o.   MRN: [id number] Returned the FMLA forms back to pt. He will give to [facility] when visits tomorrow. [name]-girlfriend to come in Sunday for family education in preparation of discharge Tuesday.

## 2021-01-14 NOTE — Progress Notes (Addendum)
Patient ID: Robert Good, male   DOB: [birth year], 30 y.o.   MRN: [id number] Spoke with Cline Cools to clarify equipment needs and home health follow up. Have found [facility] to provide PT & OT follow. Have arranged family education for Sunday at 10-12 with [name]-girlfriend. Work toward discharge for Tuesday

## 2021-01-14 NOTE — Progress Notes (Signed)
Occupational Therapy Session Note  Patient Details  Name: Robert Good MRN: [id number] Date of Birth: Aug 09, 1990  Today's Date: 01/14/2021 OT Individual Time: 1100-1200 OT Individual Time Calculation (min): 60 min    Short Term Goals: Week 1:  OT Short Term Goal 1 (Week 1): Pt will complete toilet transfer with CGA. OT Short Term Goal 2 (Week 1): Pt will tolerate standing in prep for standing ADL >5 min with CGA. OT Short Term Goal 3 (Week 1): Pt will manage RUE during functional mobility with ind. Week 2:     Skilled Therapeutic Interventions/Progress Updates:    Patient in bed, alert and ready for therapy session.  New wrist cock up more comfortable and fitting well per patient.  He notes inconsistent nerve pain t/o right arm that is independent of positioning and activity - he did not sleep well last night due to shooting pains.  Pain under control at this time and he is eager to continue with therapy.  He declines adl needs at this time.  Bed mobility CS, he is able to donn CAM boot and right sneaker with set up.  Sit pivot transfer with CS to/from bed w/c and mat table.  Provided wedge for support of right arm.  Trial with FES to right shoulder, bicep and tricep with no muscle contraction noted at deltoid, bicep/tricep.  No sensory discomfort with stim or issues with increased nerve pain.  Completed closed chain shoulder/scapula activities with skateboard.   Retaped for shoulder subluxation/support of humeral head, and facilitation of rhomboids and deltoid.  He returned to bed at close of session - able to doff CAM boot, sneaker and donn slipper sock with set up.  Bed alarm set and call bell in reach.    Therapy Documentation Precautions:  Precautions Precautions: Fall Precaution Comments: no active movement/sensation in RUE 2/2 brachial plexus injury Required Braces or Orthoses: Sling Restrictions Weight Bearing Restrictions: Yes RUE Weight Bearing: Weight bearing as  tolerated LLE Weight Bearing: Touchdown weight bearing Other Position/Activity Restrictions: CAM walker boot on LLE when OOB   Therapy/Group: Individual Therapy  Barrie Lyme 01/14/2021, 12:06 PM

## 2021-01-14 NOTE — Progress Notes (Signed)
Physical Therapy Session Note  Patient Details  Name: Robert Good MRN: [id number] Date of Birth: [birth year]  Today's Date: 01/14/2021 PT Individual Time:Session1: 9675-9163; Session2:1300-1355 PT Individual Time Calculation (min): 60 min & 55 min  Short Term Goals: Week 1:  PT Short Term Goal 1 (Week 1): Patient will tolerate sitting in wc between therapies for at least 1 hour PT Short Term Goal 2 (Week 1): Patient will complete sit <> stand with LRAD and no more than CGA consistently PT Short Term Goal 3 (Week 1): Patient will transfer bed <> wc with LRAD and no more than CGA consistently PT Short Term Goal 4 (Week 1): Patient will ambulate at least 73ft with LRAD and no more than MinA  Skilled Therapeutic Interventions/Progress Updates:    Session1: Patient in supine and MD in for assessment.  Reports pain in R hand dorsum and up the arm worst than he has had and intermittent.  Patient supine for therex and HEP initiation.  Performed SAQ w/ 5 sec hold bilateral, heel slides, hip adductor set with quad set w/ 5 sec hold, SLR, single limb bridge with SLR on L, hip abduction clamshell in hooklying with yellow t-band all x 10-15.  Also issued standing hip abduction and extension on HEP.  Patient supine to sit with S and increased time.  Assisted to don wrist splint, sling, L camboot and to tie shoe on R.  Patient transferred to w/c with S squat pivot.  Propelled in w/c to dayroom.  Sit to stand to [facility] with S to CGA and increased time performed two standing bouts with S to CGA to play Wii bowling one seated rest in middle of bowling 1 frame.  Patient educated in and issued written HEP.  Propelled to room.  Asked to return to supine.  Squat pivot to bed with S.  Assist to remove sling, camboot, wrist brace.  Left with call bell and needs in reach and bed alarm active.   Session2:Patient in supine asleep, but aroused with time.  Reports pain is bearable when he is sleeping.  Patient supine to  sit with S and time and effort with pain in R rib.  Seated EOB with assist to don R wrist splint, sling, L camboot for time.  Patient donned shoe on R independent.  Squat pivot to w/c with S.  Patient propelled w/c with S to stairwell outside therapy gym.  Patient transfer to second step from w/c with CGA.  Patient negotiated up & down 10 steps bumping up on bottoms with S using L UE and R LE.  Patient transferred back to w/c with CGA.  Patient propelled in w/c to ortho gym.  Standing with [facility] and intermittent UE support and S for 2 minutes reaction time tapping targets over whole screen on BITS.  Patient sit to stand to [facility] and ambulated with close S to CGA keeping TDWB L LE after demonstration and cues x 10'.  Obtained axillary crutch and pt able to ambulate with crutch and close S to CGA TDWB L LE x 12'.  Patient reports crutch easier to use despite having to retrieve when standing.  Patient performed car transfer to simulated small SUV height with close S and assist for w/c set up.  Patient grimacing and holding R hand and reports severe pain.  Able to propel to room and RN informed pt needing medication.  Patient in bathroom standing to toilet with distant S cues to use grabbar.  Patient in w/c  to wash hand with S.  Transferred to bed with S and assist to doff wrist brace, sling and camboot.  Left in supine with call bell and needs in reach and bed alarm active.  Missed 20 minutes skilled PT due to pain.  Therapy Documentation Precautions:  Precautions Precautions: Fall Precaution Comments: no active movement/sensation in RUE 2/2 brachial plexus injury Required Braces or Orthoses: Sling Restrictions Weight Bearing Restrictions: Yes RUE Weight Bearing: Weight bearing as tolerated LLE Weight Bearing: Touchdown weight bearing Other Position/Activity Restrictions: CAM walker boot on LLE when OOB General:  Patient missed 20 minutes skilled PT due to R UE pain  Pain: Pain Assessment Pain Score: 5   Faces Pain Scale: Hurts whole lot Pain Type: Neuropathic pain Pain Location: Arm Pain Orientation: Right Pain Descriptors / Indicators: Tingling;Grimacing;Moaning;Guarding;Shooting Pain Onset: On-going Pain Intervention(s): Distraction;Rest    Therapy/Group: Individual Therapy  Elray Mcgregor [place], PT 01/14/2021, 12:08 PM

## 2021-01-15 ENCOUNTER — Other Ambulatory Visit: Payer: Self-pay | Admitting: *Deleted

## 2021-01-15 DIAGNOSIS — I6389 Other cerebral infarction: Secondary | ICD-10-CM

## 2021-01-15 DIAGNOSIS — S25101D Unspecified injury of right innominate or subclavian artery, subsequent encounter: Secondary | ICD-10-CM

## 2021-01-15 MED ORDER — POLYETHYLENE GLYCOL 3350 17 G PO PACK: 17.0000 g | PACK | Freq: Every day | ORAL | 0 refills | Status: DC

## 2021-01-15 MED ORDER — POTASSIUM CHLORIDE CRYS ER 20 MEQ PO TBCR: 20.0000 meq | EXTENDED_RELEASE_TABLET | Freq: Two times a day (BID) | ORAL | 0 refills | Status: DC

## 2021-01-15 MED ORDER — LIDOCAINE 5 % EX PTCH: 3.0000 | MEDICATED_PATCH | CUTANEOUS | 0 refills | Status: DC

## 2021-01-15 MED ORDER — SORBITOL 70 % SOLN
60.0000 mL | Freq: Once | Status: AC
  Administered 2021-01-15: 60 mL via ORAL
  Filled 2021-01-15: qty 60

## 2021-01-15 MED ORDER — CYCLOBENZAPRINE HCL 5 MG PO TABS: 5.0000 mg | ORAL_TABLET | Freq: Three times a day (TID) | ORAL | 0 refills | Status: DC

## 2021-01-15 MED ORDER — AMITRIPTYLINE HCL 10 MG PO TABS: 10.0000 mg | ORAL_TABLET | Freq: Every day | ORAL | 0 refills | Status: DC

## 2021-01-15 MED ORDER — ENOXAPARIN SODIUM 30 MG/0.3ML IJ SOSY: 30.0000 mg | PREFILLED_SYRINGE | Freq: Two times a day (BID) | INTRAMUSCULAR | 0 refills | Status: DC

## 2021-01-15 MED ORDER — TRAZODONE HCL 50 MG PO TABS: 25.0000 mg | ORAL_TABLET | Freq: Every evening | ORAL | 0 refills | Status: DC | PRN

## 2021-01-15 MED ORDER — OXYCODONE HCL 5 MG PO TABS: 5.0000 mg | ORAL_TABLET | Freq: Four times a day (QID) | ORAL | 0 refills | Status: DC | PRN

## 2021-01-15 MED ORDER — GABAPENTIN 400 MG PO CAPS: 800.0000 mg | ORAL_CAPSULE | Freq: Three times a day (TID) | ORAL | 0 refills | Status: DC

## 2021-01-15 MED ORDER — ASPIRIN 81 MG PO TBEC: 81.0000 mg | DELAYED_RELEASE_TABLET | Freq: Every day | ORAL | 11 refills | Status: DC

## 2021-01-15 MED ORDER — FENTANYL 12 MCG/HR TD PT72: 1.0000 | MEDICATED_PATCH | TRANSDERMAL | 0 refills | Status: DC

## 2021-01-15 MED ORDER — VITAMIN D (ERGOCALCIFEROL) 1.25 MG (50000 UNIT) PO CAPS: 50000.0000 [IU] | ORAL_CAPSULE | ORAL | 0 refills | Status: DC

## 2021-01-15 MED ORDER — AMITRIPTYLINE HCL 10 MG PO TABS
10.0000 mg | ORAL_TABLET | Freq: Every day | ORAL | Status: DC
  Administered 2021-01-15 – 2021-01-18 (×4): 10 mg via ORAL
  Filled 2021-01-15 (×5): qty 1

## 2021-01-15 MED ORDER — PANTOPRAZOLE SODIUM 40 MG PO TBEC: 40.0000 mg | DELAYED_RELEASE_TABLET | Freq: Every day | ORAL | 0 refills | Status: DC

## 2021-01-15 NOTE — Progress Notes (Signed)
Occupational Therapy Session Note  Patient Details  Name: Robert Good MRN: [id number] Date of Birth: 1991/03/18  Today's Date: 01/15/2021 OT Individual Time: 3212-2482 OT Individual Time Calculation (min): 29 min    Short Term Goals: Week 1:  OT Short Term Goal 1 (Week 1): Pt will complete toilet transfer with CGA. OT Short Term Goal 2 (Week 1): Pt will tolerate standing in prep for standing ADL >5 min with CGA. OT Short Term Goal 3 (Week 1): Pt will manage RUE during functional mobility with ind.  Skilled Therapeutic Interventions/Progress Updates:    1;1. Pt received in bed agreeable to OT. Pt with spasm in leg and practicing deep breathing techniques without cuing. Also educated pt with pendulation technique to use in conjunction with diaphramatic breathing- approaching pain like a campfire- close enough to acknowledge pain but not get burned, and then moving attention to a place that does not hurt. Alternating between the two. Also educated on adaptive gaming, cooking, and IADL strateges.   Bed level LB therex per pt request, heel slides 1 set with resistance level 4 theraband and 1 set without. Hip ab/adduction on bed with straight leg, SLR, and hip ab.adduct with theraband. All RLE and VC for technique.   Exited session with pt seated in bed, exit alarm on and call light in reach   Therapy Documentation Precautions:  Precautions Precautions: Fall Precaution Comments: no active movement/sensation in RUE 2/2 brachial plexus injury Required Braces or Orthoses: Sling Restrictions Weight Bearing Restrictions: Yes RUE Weight Bearing: Weight bearing as tolerated LLE Weight Bearing: Touchdown weight bearing Other Position/Activity Restrictions: CAM walker boot on LLE when OOB General:   Vital Signs: Therapy Vitals Temp: 98.1 F (36.7 C) Pulse Rate: 66 Resp: 18 BP: 109/65 Patient Position (if appropriate): Lying Oxygen Therapy SpO2: 99 % O2 Device: Room  Air Pain:   ADL: ADL Eating: Minimal assistance Where Assessed-Eating: Edge of bed Grooming: Minimal assistance Where Assessed-Grooming: Standing at sink, Edge of bed Upper Body Bathing: Minimal assistance Where Assessed-Upper Body Bathing: Edge of bed Lower Body Bathing: Minimal assistance Where Assessed-Lower Body Bathing: Edge of bed, Standing at sink Upper Body Dressing: Moderate assistance Where Assessed-Upper Body Dressing: Edge of bed Lower Body Dressing: Minimal assistance Where Assessed-Lower Body Dressing: Edge of bed Toileting: Minimal assistance Where Assessed-Toileting: Bedside Commode Toilet Transfer: Minimal assistance Toilet Transfer Method: Squat pivot Acupuncturist: Grab bars, Raised toilet seat Tub/Shower Transfer: Not assessed Film/video editor: Not assessed Vision   Perception    Praxis   Exercises:   Other Treatments:     Therapy/Group: Individual Therapy  Shon Hale 01/15/2021, 6:52 AM

## 2021-01-15 NOTE — Progress Notes (Signed)
Physical Therapy Session Note  Patient Details  Name: Robert Good MRN: [id number] Date of Birth: [birth year]  Today's Date: 01/15/2021 PT Individual Time: 1415-1530 PT Individual Time Calculation (min): 75 min   Short Term Goals: Week 1:  PT Short Term Goal 1 (Week 1): Patient will tolerate sitting in wc between therapies for at least 1 hour PT Short Term Goal 2 (Week 1): Patient will complete sit <> stand with LRAD and no more than CGA consistently PT Short Term Goal 3 (Week 1): Patient will transfer bed <> wc with LRAD and no more than CGA consistently PT Short Term Goal 4 (Week 1): Patient will ambulate at least 13ft with LRAD and no more than MinA  Skilled Therapeutic Interventions/Progress Updates:    Patient in supine and reports having more painful sensations again in hand.  States OT provided nerve glide stretches earlier today.  States girlfriend here this morning and observed him negotiate steps in stair well.  Performed supine to sit with S and requesting to toilet so just donned camboot and sling and pt transferred to w/c with S.  Assisted to bathroom and pt toileted with distant supervision.  Patient donned wrist splint with A and R shoe with S.  Propelled in w/c to ortho gym.  Performed transfer to car at simulated small SUV height with S.  Demonstrated to pt how girlfriend will have to manage wheelchair/equipment.  Also educated on when to use QC vs crutch.  Patient sit to stand to crutch with S and ambulated 20' with CGA.  Patient assisted in w/c to dayroom. Transferred with S to [facility] [employer] and performed x 8 minutes with L UE and R LE.  Patient assisted in w/c to room.  Transfer to bed with S squat pivot transfer.  Sit to supine with S.  Performed HEP with min cues consisting of hip adductor squeezes with quad set x 10 w/ 5 sec hold, SAQ w/ 5 sec hold, single limb bridge on R with L SLR x 10, hooklying hip abduction with blue t-band x 15, SLR bilat x 10.  Patient left in supine  with all needs in reach and bed alarm active.   Therapy Documentation Precautions:  Precautions Precautions: Fall Precaution Comments: no active movement/sensation in RUE 2/2 brachial plexus injury Required Braces or Orthoses: Sling Restrictions Weight Bearing Restrictions: Yes RUE Weight Bearing: Weight bearing as tolerated LLE Weight Bearing: Touchdown weight bearing Other Position/Activity Restrictions: CAM walker boot on LLE when OOB  Pain: Pain Assessment Pain Scale: 0-10 Pain Score: 0-No pain Faces Pain Scale: Hurts whole lot Pain Type: Neuropathic pain Pain Location: Finger (Comment which one) Pain Orientation: Right Pain Descriptors / Indicators: Stabbing;Sharp Pain Onset: On-going Pain Intervention(s): Repositioned;Cutaneous stimulation;Distraction    Therapy/Group: Individual Therapy  Elray Mcgregor Sheran Lawless, PT 01/15/2021, 5:38 PM

## 2021-01-15 NOTE — Progress Notes (Signed)
Physical Therapy Session Note  Patient Details  Name: Robert Good MRN: [id number] Date of Birth: 01-30-1991  Today's Date: 01/15/2021 PT Individual Time: 0800-0900 PT Individual Time Calculation (min): 60 min   Short Term Goals: Week 1:  PT Short Term Goal 1 (Week 1): Patient will tolerate sitting in wc between therapies for at least 1 hour PT Short Term Goal 2 (Week 1): Patient will complete sit <> stand with LRAD and no more than CGA consistently PT Short Term Goal 3 (Week 1): Patient will transfer bed <> wc with LRAD and no more than CGA consistently PT Short Term Goal 4 (Week 1): Patient will ambulate at least 46ft with LRAD and no more than MinA  Skilled Therapeutic Interventions/Progress Updates:    Pt greeted supine in bed at start of session, girlfriend at bedside and pt agreeable to PT tx. Reports RUE pain but also reports pain medications were provided ~1hr prior to PT arrival. Pt requesting girlfriend to observe PT session - impromptu family training/ed provided for 1st half of session (girlfriend left 1/2 way through to go to work). Pt with good ability to direct level of care during session. Girlfriend assisted with donning RUE sling while pt sat EOB. Bed mobility completed with supervision although pain inhibiting ability. Donned CAM boot with totalA to L foot while he at EOB. Completed squat<>pivot transfer with supervision from EOB to w/c and pt propelled himself throughout session with supervision by using his LUE and RLE. RN arriving to inform us that he needed to remove sutures while pt was standing, per MD order. With axillary crutch, pt able to stand with CGA with min cues for WB compliance. Pt able to maintain standing for ~5 minutes while RN removed sutures, standing balance with CGA. Next, worked on Museum/gallery curator with girlfriend present for observation. Pt able to complete w/c to stair transfer with supervision where he sat on his bottom. He navigated 8 steps in  stair well with supervision while "scooting up" on his bottom - adhered to WB compliance throughout. Next, worked on car transfer with girlfriend observing - car height set to simulate their mid-size SUV - pt completed with supervision but needed cues for parking w/c closer to car prior to transfer to reduce his falls risk. Focused remainder of session on lower extremity strengthening. Completed squat<>pivot transfer with supervision to mat table. Required wedge for trunk as pt unable to lay flat 2/2 rib fx pain. Completed the following there-ex: -LLE heel slides -LLE hip abduction -LLE SLR -RLE single leg bridges **1x15 repetitions with AAROM to tolerance for there-ex.  Supine<>Sit on mat table with supervision and squat<>pivot transfer with supervision to w/c. Pt propelled himself with supervision in w/c back to his room, ~341ft, and completed squat<>pivot transfer with supervision back to bed and bed mobility completed with supervision. Bed alarm on and all needs within reach at end of session. Removed RUE sling and LLE CAM boot at end of session.   Therapy Documentation Precautions:  Precautions Precautions: Fall Precaution Comments: no active movement/sensation in RUE 2/2 brachial plexus injury Required Braces or Orthoses: Sling Restrictions Weight Bearing Restrictions: Yes RUE Weight Bearing: Weight bearing as tolerated LLE Weight Bearing: Touchdown weight bearing Other Position/Activity Restrictions: CAM walker boot on LLE when OOB General:    Therapy/Group: Individual Therapy  Khia Dieterich P Marceline Napierala PT 01/15/2021, 7:45 AM

## 2021-01-15 NOTE — Progress Notes (Addendum)
Patient ID: Robert Good, male   DOB: 1991-07-02, 30 y.o.   MRN: [id number] [facility] hopefully will be at his home on Tuesday. Have new home address to send to. [facility] has been approved for follow up. Girlfriend to be here on Sunday for education in preparation of discharge Tuesday. [facility] hopefully will be at home by Tuesday. Continue to work on discharge needs for Tuesday.  3:37 PM [facility] did not receive [facility] asked to e[facility] instead. [facility] has e-scribed the meds, but pt will need to change his address since [place] has old address. He was given the number and was in the process of changing address to current address.

## 2021-01-15 NOTE — Progress Notes (Signed)
PROGRESS NOTE   Subjective/Complaints: Chest wall pain a little better today. Still having significant right arm pain. Asked about prognosis for RUE. Making gains in therapy though.   ROS: Patient denies fever, rash, sore throat, blurred vision, nausea, vomiting, diarrhea, cough, shortness of breath or chest pain,   headache, or mood change.    Objective:   DG Tibia/Fibula Left  Result Date: 01/14/2021 CLINICAL DATA:  Left tibial and fibular fractures. EXAM: LEFT TIBIA AND FIBULA - 2 VIEW COMPARISON:  Dec 25, 2020. FINDINGS: Status post intramedullary rod fixation of distal left tibial shaft fracture. Stable appearance of comminuted fractures involving the distal left tibia and fibula is noted. IMPRESSION: Status post intramedullary rod fixation of distal left tibial shaft fracture. Stable appearance of comminuted distal left tibial and fibular fractures are noted. Electronically Signed   By: Lupita Raider M.D.   On: 01/14/2021 08:59   DG FEMUR MIN 2 VIEWS LEFT  Result Date: 01/14/2021 CLINICAL DATA:  Left femoral fracture. EXAM: LEFT FEMUR 2 VIEWS COMPARISON:  Dec 25, 2020. FINDINGS: Status post intramedullary rod fixation of comminuted femoral shaft fracture. Alignment of fracture components is unchanged compared to prior exam. Some callus formation is noted around the fracture suggesting some healing, although persistent fracture lines remain. IMPRESSION: Status post intramedullary rod fixation of comminuted left femoral shaft fracture. Some callus formation is noted suggesting some healing. Electronically Signed   By: Lupita Raider M.D.   On: 01/14/2021 08:56   No results for input(s): WBC, HGB, HCT, PLT in the last 72 hours.  No results for input(s): NA, K, CL, CO2, GLUCOSE, BUN, CREATININE, CALCIUM in the last 72 hours.   Intake/Output Summary (Last 24 hours) at 01/15/2021 1206 Last data filed at 01/15/2021 0839 Gross per 24 hour   Intake 600 ml  Output 300 ml  Net 300 ml        Physical Exam: Vital Signs Blood pressure 109/65, pulse 66, temperature 98.1 F (36.7 C), resp. rate 18, height 5\' 8"  (1.727 m), weight 59.3 kg, SpO2 99 %. Constitutional: No distress . Vital signs reviewed. HEENT: EOMI, oral membranes moist Neck: supple Cardiovascular: RRR without murmur. No JVD    Respiratory/Chest: CTA Bilaterally without wheezes or rales. Normal effort    GI/Abdomen: BS +, non-tender, non-distended Ext: no clubbing, cyanosis, or edema Psych: pleasant and cooperative Musculoskeletal:        right neck tightness, right rib cage tender along lower half, axillary line  Skin: left posterior thigh incision and abrasions on left shin, area clearing up. RUE forearm lacerations/abrasions with scab. Area in right axilla with steristrips. All areas clean and dry    Neurological:     Mental Status: He is alert and oriented to person, place, and time. Normal cognition    Cranial Nerves: No cranial nerve deficit.     Comments: Alert and oriented x 3. Normal insight and awareness. Intact Memory. Normal language and speech. Cranial nerve exam unremarkable . RUE with trace deltoid movement, otherwise remains 0/5. LUE 5/5. RLE 4/5. LLE 3 to 4/5 with pain inhibition. Sensation 0/2 RUE to LT middle and distally. Does sense gross touch, has dysesthesias more  proximally in arm than distally. No changes 6/10    Assessment/Plan: 1. Functional deficits which require 3+ hours per day of interdisciplinary therapy in a comprehensive inpatient rehab setting. Physiatrist is providing close team supervision and 24 hour management of active medical problems listed below. Physiatrist and rehab team continue to assess barriers to discharge/monitor patient progress toward functional and medical goals  Care Tool:  Bathing    Body parts bathed by patient: Right arm, Chest, Abdomen, Front perineal area, Right upper leg, Buttocks, Left upper leg,  Right lower leg, Left lower leg, Face   Body parts bathed by helper: Left arm     Bathing assist Assist Level: Minimal Assistance - Patient > 75%     Upper Body Dressing/Undressing Upper body dressing Upper body dressing/undressing activity did not occur (including orthotics): Safety/medical concerns What is the patient wearing?: Pull over shirt    Upper body assist Assist Level: Maximal Assistance - Patient 25 - 49%    Lower Body Dressing/Undressing Lower body dressing      What is the patient wearing?: Pants, Underwear/pull up     Lower body assist Assist for lower body dressing: Minimal Assistance - Patient > 75%     Toileting Toileting    Toileting assist Assist for toileting: Supervision/Verbal cueing (standing at toilet) Assistive Device Comment: urinal   Transfers Chair/bed transfer  Transfers assist     Chair/bed transfer assist level: Supervision/Verbal cueing     Locomotion Ambulation   Ambulation assist   Ambulation activity did not occur: Safety/medical concerns (no platform for R UE with orders to wb thru elbow only)  Assist level: Moderate Assistance - Patient 50 - 74% Assistive device: Walker-hemi Max distance: 5   Walk 10 feet activity   Assist  Walk 10 feet activity did not occur: Safety/medical concerns (no platform for R UE with orders to wb thru elbow only)  Assist level: 2 helpers (MinA + wc follow) Assistive device: Walker-hemi   Walk 50 feet activity   Assist Walk 50 feet with 2 turns activity did not occur: Safety/medical concerns (no platform for R UE with orders to wb thru elbow only)         Walk 150 feet activity   Assist Walk 150 feet activity did not occur: Safety/medical concerns (no platform for R UE with orders to wb thru elbow only)         Walk 10 feet on uneven surface  activity   Assist Walk 10 feet on uneven surfaces activity did not occur: Safety/medical concerns          Wheelchair     Assist Will patient use wheelchair at discharge?: Yes Type of Wheelchair: Manual    Wheelchair assist level: Supervision/Verbal cueing Max wheelchair distance: 200    Wheelchair 50 feet with 2 turns activity    Assist        Assist Level: Supervision/Verbal cueing   Wheelchair 150 feet activity     Assist      Assist Level: Supervision/Verbal cueing   Blood pressure 109/65, pulse 66, temperature 98.1 F (36.7 C), resp. rate 18, height 5\' 8"  (1.727 m), weight 59.3 kg, SpO2 99 %.  Medical Problem List and Plan: 1.  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 Impaired mobility -DVT/anticoagulation:  Mechanical:  Antiembolism stockings, knee (TED hose) Bilateral lower extremities. Vascular study reviewed and is negative             -antiplatelet therapy: ASA 3. Right brachial plexus injury, myofascial pain: Oxycodone prn             -increased gabapentin to 800mg  TID 6/7---observe today. Pt aware that we have to be careful when titrating meds.              -aspercreme to shoulder, neck             -sling for support, ordered cock up right wrist splint  -continue kpad             -ROM, desensitizing activities in bed, with therapy  -lidocaine patches for right lower neck as well as right lower ribs, incr to 3  -6/9 added fentanyl patch to assist with generalized pain control    -reviewed Chest CT--no rib fx's seen, pt without any respiratory c/o, sats good. -6/10 add elavil 10mg  for brachial plexus pain. Titrate as needed/tolerated    -continue gabapentin 800mg  tid as well as meds/interventions above. 4. Mood: LCSW to follow for evaluation and support.              -antipsychotic agents: N/A 5. Neuropsych: This patient is capable  of making decisions on his own behalf. 6. Skin/Wound Care:  Routine pressure relief measures.             --monitor incisions for healing.              -sutures removed by ortho 7. Hyponatremia: 139 6/6 8. Vitamin D Deficiency: On weekly supplement.  9. ABLA: Stable post transfusion: hgb improved to 11 on 6/4, monitor weekly.  10. Dizziness: Has history of vertigo.              --acclimation  -ortho vs negative.  11. Left femoral shaft fx open, left tib-fib shaft fx's  -TTWB LLE with CAM boot  -xrays reviewed and demonstrate early healing. 12. Right acromium fx  -WBAT RUE      LOS: 7 days A FACE TO FACE EVALUATION WAS PERFORMED  01/15/2021, 12:06 PM

## 2021-01-15 NOTE — Progress Notes (Signed)
Occupational Therapy Session Note  Patient Details  Name: Robert Good MRN: [id number] Date of Birth: 01-10-1991  Today's Date: 01/15/2021 OT Individual Time: 0900-1000 OT Individual Time Calculation (min): 60 min    Short Term Goals: Week 1:  OT Short Term Goal 1 (Week 1): Pt will complete toilet transfer with CGA. OT Short Term Goal 2 (Week 1): Pt will tolerate standing in prep for standing ADL >5 min with CGA. OT Short Term Goal 3 (Week 1): Pt will manage RUE during functional mobility with ind.  Skilled Therapeutic Interventions/Progress Updates:    Pt received in bed and consented to OT tx. Pt completed lateral scoot transfer to w/c with SUP, therapist supported RUE during transition. Pt requested to try e-stim again on RUE, NMES utilized on R biceps, however no muscle contraction seen or felt. Pt reported he could feel some sensation when tapping muscle belly during e-stim activation, but no contraction. Instructed pt in self ROM to decrease risk for contractures. ROM exercises included elbow flexion/extension, wrist flexion/extension, finger flexion/extension, ulnar and radial deviation, and shoulder flexion for 2x10. Pt required extensive rest break due to bout of nerve pain/burning sensation which he reports is not uncommon.   Pt instructed in towel slides for increased shoulder flexion with table raised, and shoulder internal/external rotation for 2x10 each movement. Therapist facilitated ulnar and median nerve glides with pt reporting that he is beginning to feel some sensation during those stretches. Educated on brachial plexus and nerve recovery/regeneration process, with pt verbalizing understanding. After tx, pt helped back to bed and left with all needs met.    Therapy Documentation Precautions:  Precautions Precautions: Fall Precaution Comments: no active movement/sensation in RUE 2/2 brachial plexus injury Required Braces or Orthoses: Sling Restrictions Weight  Bearing Restrictions: Yes RUE Weight Bearing: Weight bearing as tolerated LLE Weight Bearing: Touchdown weight bearing Other Position/Activity Restrictions: CAM walker boot on LLE when OOB  Pain: Pain Assessment Pain Score: 2     Therapy/Group: Individual Therapy  Krystine Pabst 01/15/2021, 10:22 AM

## 2021-01-16 DIAGNOSIS — S72002D Fracture of unspecified part of neck of left femur, subsequent encounter for closed fracture with routine healing: Secondary | ICD-10-CM

## 2021-01-16 DIAGNOSIS — K5903 Drug induced constipation: Secondary | ICD-10-CM

## 2021-01-16 DIAGNOSIS — S143XXD Injury of brachial plexus, subsequent encounter: Principal | ICD-10-CM

## 2021-01-16 MED ORDER — SENNA 8.6 MG PO TABS
1.0000 | ORAL_TABLET | Freq: Two times a day (BID) | ORAL | Status: DC
  Administered 2021-01-16 – 2021-01-18 (×4): 8.6 mg via ORAL
  Filled 2021-01-16 (×6): qty 1

## 2021-01-16 MED ORDER — MAGNESIUM CITRATE PO SOLN
1.0000 | Freq: Once | ORAL | Status: AC
  Administered 2021-01-16: 1 via ORAL
  Filled 2021-01-16: qty 296

## 2021-01-16 NOTE — Progress Notes (Signed)
Occupational Therapy Session Note  Patient Details  Name: Robert Good MRN: [id number] Date of Birth: 11/05/1990  Today's Date: 01/16/2021 OT Individual Time: 9150-5697 OT Individual Time Calculation (min): 60 min    Short Term Goals: Week 1:  OT Short Term Goal 1 (Week 1): Pt will complete toilet transfer with CGA. OT Short Term Goal 2 (Week 1): Pt will tolerate standing in prep for standing ADL >5 min with CGA. OT Short Term Goal 3 (Week 1): Pt will manage RUE during functional mobility with ind.  Skilled Therapeutic Interventions/Progress Updates:    Pt greeted semi-reclined in bed asleep with significant other present and agreeable to OT treatment session. Pt completed bed mobility with CGA to get LLE to floor. OT assist to don L CAM boot and R shoe for time management. Lateral scoot to wc with supervision. Pt reported need to urinate. WC brought into bathroom and pt stood to urinate with supervision. Pt propelled wc to elevators using one handed technique while OT educated on community reintegration and elevator access at wc level. Discussed community barriers, public bathroom transfers, and general safety from wc level. Worked on R UE NMR with towel pushes with hand skate on picnic table. Pt with good scapula activation and some shoulder to push skate forward. OT took pt through nerve glides on R UE and educated significant other on positioning for nerve glide. OT had sig other assist with positioning and educated on provided adequate support. Pt returned to room at end of session and left seated in wc with sig other present and needs met.   Therapy Documentation Precautions:  Precautions Precautions: Fall Precaution Comments: no active movement/sensation in RUE 2/2 brachial plexus injury Required Braces or Orthoses: Sling Restrictions Weight Bearing Restrictions: Yes RUE Weight Bearing: Weight bearing as tolerated LLE Weight Bearing: Touchdown weight bearing Other  Position/Activity Restrictions: CAM walker boot on LLE when OOB Pain: Pain Assessment Pain Scale: 0-10 Pain Score: 7  Pain Type: Surgical pain;Acute pain Pain Location: Shoulder Pain Orientation: Right Pain Descriptors / Indicators: Aching Pain Frequency: Constant Pain Onset: On-going Patients Stated Pain Goal: 4 Pain Intervention(s): Repositioned   Therapy/Group: Individual Therapy  Valma Cava 01/16/2021, 2:11 PM

## 2021-01-16 NOTE — Progress Notes (Signed)
PROGRESS NOTE   Subjective/Complaints:  Pt reports main issue , pain wise is rib pain- but duragesic has helped a lot- doesn't need lidoderm patches anymore will d/c.   LBM 1 week ago- tried sorbitol 60cc yesterday- stomach "crazy" since then, but no BM- refusing enema- will try Mg citrate.  R hand also still burning- on gabapentin- said Dr Riley Kill mentioed Lyrica?   ROS:  Pt denies SOB, abd pain, CP, N/V/C/D, and vision changes   Objective:   No results found. No results for input(s): WBC, HGB, HCT, PLT in the last 72 hours.  No results for input(s): NA, K, CL, CO2, GLUCOSE, BUN, CREATININE, CALCIUM in the last 72 hours.   Intake/Output Summary (Last 24 hours) at 01/16/2021 1021 Last data filed at 01/16/2021 0630 Gross per 24 hour  Intake 0 ml  Output 500 ml  Net -500 ml        Physical Exam: Vital Signs Blood pressure 116/77, pulse 70, temperature 98 F (36.7 C), temperature source Oral, resp. rate 17, height 5\' 8"  (1.727 m), weight 62.1 kg, SpO2 100 %.   General: awake, alert, appropriate, GF and nurse in room; sitting EOB; NAD HENT: conjugate gaze; oropharynx moist CV: regular rate; no JVD Pulmonary: CTA B/L; no W/R/R- good air movement GI: soft, NT, ND, (+)BS- hypoactive, but not distended Psychiatric: appropriate- a little frustrated about pain Neurological: Ox3 Ext: no clubbing, cyanosis, or edema; RUE with L tape noted Psych: pleasant and cooperative Musculoskeletal:        right neck tightness, right rib cage tender along lower half, axillary line  Skin: left posterior thigh incision and abrasions on left shin, area clearing up. RUE forearm lacerations/abrasions with scab. Area in right axilla with steristrips. All areas clean and dry    Neurological:     Mental Status: He is alert and oriented to person, place, and time. Normal cognition    Cranial Nerves: No cranial nerve deficit.     Comments:  Alert and oriented x 3. Normal insight and awareness. Intact Memory. Normal language and speech. Cranial nerve exam unremarkable . RUE with trace deltoid movement, otherwise remains 0/5. LUE 5/5. RLE 4/5. LLE 3 to 4/5 with pain inhibition. Sensation 0/2 RUE to LT middle and distally. Does sense gross touch, has dysesthesias more proximally in arm than distally. No changes 6/10    Assessment/Plan: 1. Functional deficits which require 3+ hours per day of interdisciplinary therapy in a comprehensive inpatient rehab setting. Physiatrist is providing close team supervision and 24 hour management of active medical problems listed below. Physiatrist and rehab team continue to assess barriers to discharge/monitor patient progress toward functional and medical goals  Care Tool:  Bathing    Body parts bathed by patient: Right arm, Chest, Abdomen, Front perineal area, Right upper leg, Buttocks, Left upper leg, Right lower leg, Left lower leg, Face   Body parts bathed by helper: Left arm     Bathing assist Assist Level: Minimal Assistance - Patient > 75%     Upper Body Dressing/Undressing Upper body dressing Upper body dressing/undressing activity did not occur (including orthotics): Safety/medical concerns What is the patient wearing?: Pull over shirt  Upper body assist Assist Level: Maximal Assistance - Patient 25 - 49%    Lower Body Dressing/Undressing Lower body dressing      What is the patient wearing?: Pants, Underwear/pull up     Lower body assist Assist for lower body dressing: Minimal Assistance - Patient > 75%     Toileting Toileting    Toileting assist Assist for toileting: Supervision/Verbal cueing (standing at toilet) Assistive Device Comment: urinal   Transfers Chair/bed transfer  Transfers assist     Chair/bed transfer assist level: Supervision/Verbal cueing     Locomotion Ambulation   Ambulation assist   Ambulation activity did not occur: Safety/medical  concerns (no platform for R UE with orders to wb thru elbow only)  Assist level: Contact Guard/Touching assist Assistive device: Crutches (one crutch) Max distance: 20   Walk 10 feet activity   Assist  Walk 10 feet activity did not occur: Safety/medical concerns (no platform for R UE with orders to wb thru elbow only)  Assist level: Contact Guard/Touching assist Assistive device: Crutches   Walk 50 feet activity   Assist Walk 50 feet with 2 turns activity did not occur: Safety/medical concerns (no platform for R UE with orders to wb thru elbow only)         Walk 150 feet activity   Assist Walk 150 feet activity did not occur: Safety/medical concerns (no platform for R UE with orders to wb thru elbow only)         Walk 10 feet on uneven surface  activity   Assist Walk 10 feet on uneven surfaces activity did not occur: Safety/medical concerns         Wheelchair     Assist Will patient use wheelchair at discharge?: Yes Type of Wheelchair: Manual    Wheelchair assist level: Supervision/Verbal cueing Max wheelchair distance: 200    Wheelchair 50 feet with 2 turns activity    Assist        Assist Level: Supervision/Verbal cueing   Wheelchair 150 feet activity     Assist      Assist Level: Supervision/Verbal cueing   Blood pressure 116/77, pulse 70, temperature 98 F (36.7 C), temperature source Oral, resp. rate 17, height 5\' 8"  (1.727 m), weight 62.1 kg, SpO2 100 %.  Medical Problem List and Plan: 1.  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 Impaired mobility -DVT/anticoagulation:  Mechanical:  Antiembolism stockings, knee (TED hose) Bilateral lower extremities. Vascular study reviewed and is negative             -antiplatelet therapy:  ASA 3. Right brachial plexus injury, myofascial pain: Oxycodone prn             -increased gabapentin to 800mg  TID 6/7---observe today. Pt aware that we have to be careful when titrating meds.              -aspercreme to shoulder, neck             -sling for support, ordered cock up right wrist splint  -continue kpad             -ROM, desensitizing activities in bed, with therapy  -lidocaine patches for right lower  neck as well as right lower ribs, incr to 3  -6/9 added fentanyl patch to assist with generalized pain control    -reviewed Chest CT--no rib fx's seen, pt without any respiratory c/o, sats good. -6/10 add elavil 10mg  for brachial plexus pain. Titrate as needed/tolerated    -continue gabapentin 800mg  tid as well as meds/interventions above. 6/11- will stop lidoderm patches per pt request- pt feels duragesic better for pain and doesn't need lidoderm anymore. Pt asking about Lyrica vs gabapentin- will have Dr address.  4. Mood: LCSW to follow for evaluation and support.              -antipsychotic agents: N/A 5. Neuropsych: This patient is capable of making decisions on his own behalf. 6. Skin/Wound Care:  Routine pressure relief measures.             --monitor incisions for healing.              -sutures removed by ortho 7. Hyponatremia: 139 6/6 8. Vitamin D Deficiency: On weekly supplement.  9. ABLA: Stable post transfusion: hgb improved to 11 on 6/4, monitor weekly.  10. Dizziness: Has history of vertigo.              --acclimation  -ortho vs negative.  11. Left femoral shaft fx open, left tib-fib shaft fx's  -TTWB LLE with CAM boot  -xrays reviewed and demonstrate early healing. 12. Right acromium fx  -WBAT RUE 13. Constipation  6/11- LBM 1 week ago- had sorbitol yesterday- no results- will give Mg citrate - 1/2 to full bottle- and add senokot 1 tab BID since added Duragesic a couple of days ago.       LOS: 8 days A FACE TO FACE EVALUATION WAS  PERFORMED  Robert Good 01/16/2021, 10:21 AM

## 2021-01-16 NOTE — Progress Notes (Signed)
Physical Therapy Session Note  Patient Details  Name: Robert Good MRN: [id number] Date of Birth: Apr 25, 1991  Today's Date: 01/16/2021 PT Individual Time: 1497-0263 PT Individual Time Calculation (min): 71 min   Short Term Goals: Week 1:  PT Short Term Goal 1 (Week 1): Patient will tolerate sitting in wc between therapies for at least 1 hour PT Short Term Goal 2 (Week 1): Patient will complete sit <> stand with LRAD and no more than CGA consistently PT Short Term Goal 3 (Week 1): Patient will transfer bed <> wc with LRAD and no more than CGA consistently PT Short Term Goal 4 (Week 1): Patient will ambulate at least 33ft with LRAD and no more than MinA   Skilled Therapeutic Interventions/Progress Updates:  Patient supine in bed on entrance to room with girlfriend present. Patient initially asleep and required time to fully rouse. Had not had AM medications including pain medications and RN notified to provide for 7/10 pain at rest prior to therapy session. With medication, pt agreeable to PT session. Largest pain complaint is R wrist, shoulder, and R ribs. All related to RN and MD on AM rounding.  Therapeutic Activity: Bed Mobility: Patient performed supine <> sit with supervision. No vc/ tc required for technique. On rise to sit, pt's girlfriend assisted with donning of CAM boot with supervision and is able to don correctly with pt stating snug fit. Pt's new wrist brace donned with Max A and verbal instructions with visual demonstration to girlfriend for donning and fit. Provided reasoning for support to wrist in extended positioning. Girlfriend also able to don arm sling for pt's support and comfort with supervision.  Transfers: Patient performed squat pivot transfer to w/c from EOB requiring 2 efforts and is able to maintain all WB precautions. STS and SPVT transfers performed throughout session with single crutch to L side. Requires setup for hand off of crutch upon rise from seat.    Girlfriend related desire to see tub transfer bench to understand placement and use. TTB placed as per home environment and pt able to perform transfer with supervision and maintaining WB precautions. Suggestions provided re: shower curtain use during shower to keep water in shower stall.   Gait Training:  Patient ambulated 101' x1/ 20' x1 using single crutch and TDWB technique with LLE with close supervision provided by girlfriend. Provided vc/ tc to girlfriend for TDWB meaning and hand placement to assist if pt's balance wavers.  Wheelchair Mobility:  Patient propelled wheelchair 150 feet with supervision/ Mod I. No vc required to maneuver or propel. Min/ Mod A for management of parts.   On return to room, pt c/o pain and swelling to R shoulder with visible swelling just anterior to trapezius. Pt requests heat pack for pain relief as cold has not help with neurogenic pain in the past. RN notified and provided heat packs for pt  Patient seated EOB at end of session in order to eat breakfast that girlfriend has brought. Bed alarm set, and all needs within reach, RN and NT aware of pt positioning with girlfriend in room.      Therapy Documentation Precautions:  Precautions Precautions: Fall Precaution Comments: no active movement/sensation in RUE 2/2 brachial plexus injury Required Braces or Orthoses: Sling Restrictions Weight Bearing Restrictions: Yes RUE Weight Bearing: Weight bearing as tolerated LLE Weight Bearing: Touchdown weight bearing Other Position/Activity Restrictions: CAM walker boot on LLE when OOB  Vital Signs: Therapy Vitals Temp: 98 F (36.7 C) Temp Source: Oral Pulse  Rate: 70 Resp: 17 BP: 116/77 Patient Position (if appropriate): Lying Oxygen Therapy SpO2: 100 % O2 Device: Room Air    Therapy/Group: Individual Therapy  Loel Dubonnet PT, DPT 01/16/2021, 10:12 AM

## 2021-01-17 NOTE — Progress Notes (Signed)
PROGRESS NOTE   Subjective/Complaints:  Pt reports just finished Mag Citrate the rest of bottle this AM- no BM yet, but was 20 minutes ago, he finished it- feels like could go soon.    ROS:   Pt denies SOB, abd pain, CP, N/V, and vision changes   Objective:   No results found. No results for input(s): WBC, HGB, HCT, PLT in the last 72 hours.  No results for input(s): NA, K, CL, CO2, GLUCOSE, BUN, CREATININE, CALCIUM in the last 72 hours.   Intake/Output Summary (Last 24 hours) at 01/17/2021 1017 Last data filed at 01/17/2021 0400 Gross per 24 hour  Intake 240 ml  Output 400 ml  Net -160 ml        Physical Exam: Vital Signs Blood pressure 117/78, pulse 71, temperature 97.9 F (36.6 C), resp. rate 20, height 5\' 8"  (1.727 m), weight 62.1 kg, SpO2 98 %.    General: awake, alert, appropriate, partner at bedside;  NAD HENT: conjugate gaze; oropharynx moist CV: regular rate; no JVD Pulmonary: CTA B/L; no W/R/R- good air movement GI: soft, NT, ND, (+)BS- hyperactive Psychiatric: appropriate Neurological: alert Ext: no clubbing, cyanosis, or edema; RUE with L tape noted Psych: pleasant and cooperative Musculoskeletal:        right neck tightness, right rib cage tender along lower half, axillary line  Skin: left posterior thigh incision and abrasions on left shin, area clearing up. RUE forearm lacerations/abrasions with scab. Area in right axilla with steristrips. All areas clean and dry    Neurological:     Mental Status: He is alert and oriented to person, place, and time. Normal cognition    Cranial Nerves: No cranial nerve deficit.     Comments: Alert and oriented x 3. Normal insight and awareness. Intact Memory. Normal language and speech. Cranial nerve exam unremarkable . RUE with trace deltoid movement, otherwise remains 0/5. LUE 5/5. RLE 4/5. LLE 3 to 4/5 with pain inhibition. Sensation 0/2 RUE to LT middle  and distally. Does sense gross touch, has dysesthesias more proximally in arm than distally. No changes 6/10    Assessment/Plan: 1. Functional deficits which require 3+ hours per day of interdisciplinary therapy in a comprehensive inpatient rehab setting. Physiatrist is providing close team supervision and 24 hour management of active medical problems listed below. Physiatrist and rehab team continue to assess barriers to discharge/monitor patient progress toward functional and medical goals  Care Tool:  Bathing    Body parts bathed by patient: Right arm, Chest, Abdomen, Front perineal area, Right upper leg, Buttocks, Left upper leg, Right lower leg, Left lower leg, Face   Body parts bathed by helper: Left arm     Bathing assist Assist Level: Minimal Assistance - Patient > 75%     Upper Body Dressing/Undressing Upper body dressing Upper body dressing/undressing activity did not occur (including orthotics): Safety/medical concerns What is the patient wearing?: Pull over shirt    Upper body assist Assist Level: Maximal Assistance - Patient 25 - 49%    Lower Body Dressing/Undressing Lower body dressing      What is the patient wearing?: Pants, Underwear/pull up     Lower body assist  Assist for lower body dressing: Minimal Assistance - Patient > 75%     Toileting Toileting    Toileting assist Assist for toileting: Supervision/Verbal cueing (standing at toilet) Assistive Device Comment: urinal   Transfers Chair/bed transfer  Transfers assist     Chair/bed transfer assist level: Supervision/Verbal cueing     Locomotion Ambulation   Ambulation assist   Ambulation activity did not occur: Safety/medical concerns (no platform for R UE with orders to wb thru elbow only)  Assist level: Contact Guard/Touching assist Assistive device: Crutches (one crutch) Max distance: 20   Walk 10 feet activity   Assist  Walk 10 feet activity did not occur: Safety/medical  concerns (no platform for R UE with orders to wb thru elbow only)  Assist level: Contact Guard/Touching assist Assistive device: Crutches   Walk 50 feet activity   Assist Walk 50 feet with 2 turns activity did not occur: Safety/medical concerns (no platform for R UE with orders to wb thru elbow only)         Walk 150 feet activity   Assist Walk 150 feet activity did not occur: Safety/medical concerns (no platform for R UE with orders to wb thru elbow only)         Walk 10 feet on uneven surface  activity   Assist Walk 10 feet on uneven surfaces activity did not occur: Safety/medical concerns         Wheelchair     Assist Will patient use wheelchair at discharge?: Yes Type of Wheelchair: Manual    Wheelchair assist level: Supervision/Verbal cueing Max wheelchair distance: 200    Wheelchair 50 feet with 2 turns activity    Assist        Assist Level: Supervision/Verbal cueing   Wheelchair 150 feet activity     Assist      Assist Level: Supervision/Verbal cueing   Blood pressure 117/78, pulse 71, temperature 97.9 F (36.6 C), resp. rate 20, height 5\' 8"  (1.727 m), weight 62.1 kg, SpO2 98 %.  Medical Problem List and Plan: 1.  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 Impaired mobility -DVT/anticoagulation:  Mechanical:  Antiembolism stockings, knee (TED hose) Bilateral lower extremities. Vascular study reviewed and is negative             -antiplatelet therapy: ASA 3. Right brachial plexus injury, myofascial pain: Oxycodone prn             -increased gabapentin to 800mg  TID 6/7---observe today. Pt aware that we have to be careful when titrating meds.              -aspercreme to shoulder, neck             -sling for support, ordered cock up  right wrist splint  -continue kpad             -ROM, desensitizing activities in bed, with therapy  -lidocaine patches for right lower neck as well as right lower ribs, incr to 3  -6/9 added fentanyl patch to assist with generalized pain control    -reviewed Chest CT--no rib fx's seen, pt without any respiratory c/o, sats good. -6/10 add elavil 10mg  for brachial plexus  pain. Titrate as needed/tolerated    -continue gabapentin 800mg  tid as well as meds/interventions above. 6/11-6/12- will stop lidoderm patches per pt request- pt feels duragesic better for pain and doesn't need lidoderm anymore. Pt asking about Lyrica vs gabapentin- will have Dr 11-21-1977 address. - NO change 4. Mood: LCSW to follow for evaluation and support.              -antipsychotic agents: N/A 5. Neuropsych: This patient is capable of making decisions on his own behalf. 6. Skin/Wound Care:  Routine pressure relief measures.             --monitor incisions for healing.              -sutures removed by ortho 7. Hyponatremia: 139 6/6 8. Vitamin D Deficiency: On weekly supplement.  9. ABLA: Stable post transfusion: hgb improved to 11 on 6/4, monitor weekly.  10. Dizziness: Has history of vertigo.              --acclimation  -ortho vs negative.  11. Left femoral shaft fx open, left tib-fib shaft fx's  -TTWB LLE with CAM boot  -xrays reviewed and demonstrate early healing. 12. Right acromium fx  -WBAT RUE 13. Constipation  6/11- LBM 1 week ago- had sorbitol yesterday- no results- will give Mg citrate - 1/2 to full bottle- and add senokot 1 tab BID since added Duragesic a couple of days ago.    6/12- pt just finished Mg citrate this AM- no BM yet, but feels like needs to go- if hasn't gone by noon, have team let me know- might need enema.     LOS: 9 days A FACE TO FACE EVALUATION WAS PERFORMED  Robert Good 01/17/2021, 10:17 AM

## 2021-01-17 NOTE — Progress Notes (Addendum)
Physical Therapy Session Note  Patient Details  Name: Robert Good MRN: [id number] Date of Birth: Oct 15, 1990  Today's Date: 01/17/2021 PT Individual Time: 1100-1200 PT Individual Time Calculation (min): 60 min   Short Term Goals: Week 1:  PT Short Term Goal 1 (Week 1): Patient will tolerate sitting in wc between therapies for at least 1 hour PT Short Term Goal 2 (Week 1): Patient will complete sit <> stand with LRAD and no more than CGA consistently PT Short Term Goal 3 (Week 1): Patient will transfer bed <> wc with LRAD and no more than CGA consistently PT Short Term Goal 4 (Week 1): Patient will ambulate at least 55ft with LRAD and no more than MinA  Skilled Therapeutic Interventions/Progress Updates:  Pt in BR on toilet at beginning of session.  Pt continent of B and B on toilet, but not visualized by PT.  Peri care independent by pt.  SO Alexis present for family ed.  Pt rated RUE pain 7/10, neuropathic, premedicated. Pt wearing L CAM boot and RUE sling, but not wrist splint.  PT educated pt about need for splint as well to protect and position wrist in neutral.  Alexis donned it with cues.  Simulated SUV transfer to 32" height per Alexis's measurement, with PT x 1 with CGA , 1 crutch, and with Alexis x 1 with CG, crutch and cues.  Alexis instructed in parts mgt and folding and lifting wc.  Bumping wc up/down curb with PT x 1, with Alexis x 2.  Sit<>stand with 1 crutch and CGA.  Gait training with [name], 1 crutch L, x 20' including turn, with CGA.  Pt overshifts to RLE due to LL discrepancy  wearing thick CAM boot; pt would benefit from wearing R shoe during ambulation.   Educated pt and Alexis on fun activities for them that are therapeutic for pt while seated.  Using beach ball, pt volleyed ball using L hand, and kicked ball forward and laterally with LLE.  Using playground ball, pt kicked with R foot and performed soccer drills for RLE flexibility and coordination.  Alexis  verbalized and demonstrated back safety for wc mgt, simulated SUV transfer, gait with 1 crutch and therapeutic activities with pt seated.  At end of session, Alexis pushing pt back to room in wc.     Therapy Documentation Precautions:  Precautions Precautions: Fall Precaution Comments: no active movement/sensation in RUE 2/2 brachial plexus injury Required Braces or Orthoses: Sling Restrictions Weight Bearing Restrictions: Yes RUE Weight Bearing: Weight bearing as tolerated LLE Weight Bearing: Touchdown weight bearing Other Position/Activity Restrictions: CAM walker boot on LLE when OOB      Therapy/Group: Individual Therapy  Florence Yeung 01/17/2021, 12:00 PM

## 2021-01-17 NOTE — Progress Notes (Signed)
Occupational Therapy Session Note  Patient Details  Name: Robert Good MRN: [id number] Date of Birth: Aug 12, 1990  Today's Date: 01/17/2021 OT Individual Time: 1000-1055 OT Individual Time Calculation (min): 55 min    Short Term Goals: Week 1:  OT Short Term Goal 1 (Week 1): Pt will complete toilet transfer with CGA. OT Short Term Goal 2 (Week 1): Pt will tolerate standing in prep for standing ADL >5 min with CGA. OT Short Term Goal 3 (Week 1): Pt will manage RUE during functional mobility with ind.  Skilled Therapeutic Interventions/Progress Updates:    Patient seated in w/c, sig other, [employer], present for session.  He notes distal pain in right UE.  Reviewed positioning, support and activation/activity that may help with pain control (positive response this session).  Verbal review of adl, DME and safe set up of environment.  Demonstrated commode set up.  Both patient and significant other demonstrate good understanding.  Completed positioning, right UE AROM/AAROM and isometric exercises.  Trial with FES to posterior deltoid, tricep and bicep with no significant contraction - he notes sensory changes that are not noxious.  Increased activation of pec/lat and deltoid with gravity eliminated position.  Able to activate musculature to reduce subluxation.  He remained seated in w/c at close of session awaiting PT next.    Therapy Documentation Precautions:  Precautions Precautions: Fall Precaution Comments: no active movement/sensation in RUE 2/2 brachial plexus injury Required Braces or Orthoses: Sling Restrictions Weight Bearing Restrictions: Yes RUE Weight Bearing: Weight bearing as tolerated LLE Weight Bearing: Touchdown weight bearing Other Position/Activity Restrictions: CAM walker boot on LLE when OOB   Therapy/Group: Individual Therapy  Barrie Lyme 01/17/2021, 7:21 AM

## 2021-01-18 DIAGNOSIS — S143XXD Injury of brachial plexus, subsequent encounter: Principal | ICD-10-CM

## 2021-01-18 LAB — BASIC METABOLIC PANEL
Anion gap: 6 (ref 5–15)
BUN: 19 mg/dL (ref 6–20)
CO2: 32 mmol/L (ref 22–32)
Calcium: 9.8 mg/dL (ref 8.9–10.3)
Chloride: 100 mmol/L (ref 98–111)
Creatinine, Ser: 1.2 mg/dL (ref 0.61–1.24)
GFR, Estimated: >60 mL/min (ref >60)
Glucose, Bld: 101 mg/dL — ABNORMAL HIGH (ref 70–99)
Potassium: 4.1 mmol/L (ref 3.5–5.1)
Sodium: 138 mmol/L (ref 135–145)

## 2021-01-18 LAB — CBC
HCT: 34.9 % — ABNORMAL LOW (ref 39.0–52.0)
Hemoglobin: 11.4 g/dL — ABNORMAL LOW (ref 13.0–17.0)
MCH: 31.3 pg (ref 26.0–34.0)
MCHC: 32.7 g/dL (ref 30.0–36.0)
MCV: 95.9 fL (ref 80.0–100.0)
Platelets: 450 10*3/uL — ABNORMAL HIGH (ref 150–400)
RBC: 3.64 MIL/uL — ABNORMAL LOW (ref 4.22–5.81)
RDW: 14.1 % (ref 11.5–15.5)
WBC: 3.1 10*3/uL — ABNORMAL LOW (ref 4.0–10.5)
nRBC: 0 % (ref 0.0–0.2)

## 2021-01-18 MED ORDER — JUVEN PO PACK: 1.0000 | PACK | Freq: Two times a day (BID) | ORAL | Status: DC

## 2021-01-18 NOTE — Progress Notes (Signed)
PROGRESS NOTE   Subjective/Complaints: No complaints this morning   ROS:   Pt denies SOB, abd pain, CP, N/V, and vision changes   Objective:   No results found. Recent Labs    01/18/21 0545  WBC 3.1*  HGB 11.4*  HCT 34.9*  PLT 450*    Recent Labs    01/18/21 0545  NA 138  K 4.1  CL 100  CO2 32  GLUCOSE 101*  BUN 19  CREATININE 1.20  CALCIUM 9.8     Intake/Output Summary (Last 24 hours) at 01/18/2021 1546 Last data filed at 01/18/2021 1443 Gross per 24 hour  Intake 400 ml  Output --  Net 400 ml        Physical Exam: Vital Signs Blood pressure 101/60, pulse 79, temperature 98.4 F (36.9 C), temperature source Oral, resp. rate 16, height 5\' 8"  (1.727 m), weight 62.1 kg, SpO2 100 %. Gen: no distress, normal appearing HEENT: oral mucosa pink and moist, NCAT Cardio: Reg rate Chest: normal effort, normal rate of breathing Abd: soft, non-distended  Ext: no clubbing, cyanosis, or edema; RUE with L tape noted Psych: pleasant and cooperative Musculoskeletal:        right neck tightness, right rib cage tender along lower half, axillary line  Skin: left posterior thigh incision and abrasions on left shin, area clearing up. RUE forearm lacerations/abrasions with scab. Area in right axilla with steristrips. All areas clean and dry    Neurological:     Mental Status: He is alert and oriented to person, place, and time. Normal cognition    Cranial Nerves: No cranial nerve deficit.     Comments: Alert and oriented x 3. Normal insight and awareness. Intact Memory. Normal language and speech. Cranial nerve exam unremarkable . RUE with trace deltoid movement, otherwise remains 0/5. LUE 5/5. RLE 4/5. LLE 3 to 4/5 with pain inhibition. Sensation 0/2 RUE to LT middle and distally. Does sense gross touch, has dysesthesias more proximally in arm than distally. No changes 6/10    Assessment/Plan: 1. Functional  deficits which require 3+ hours per day of interdisciplinary therapy in a comprehensive inpatient rehab setting. Physiatrist is providing close team supervision and 24 hour management of active medical problems listed below. Physiatrist and rehab team continue to assess barriers to discharge/monitor patient progress toward functional and medical goals  Care Tool:  Bathing    Body parts bathed by patient: Right arm, Chest, Abdomen, Front perineal area, Right upper leg, Buttocks, Left upper leg, Right lower leg, Left lower leg, Face   Body parts bathed by helper: Left arm     Bathing assist Assist Level: Minimal Assistance - Patient > 75%     Upper Body Dressing/Undressing Upper body dressing Upper body dressing/undressing activity did not occur (including orthotics): Safety/medical concerns What is the patient wearing?: Pull over shirt, Orthosis Orthosis activity level: Performed by patient (orthosis and sling)  Upper body assist Assist Level: Independent with assistive device    Lower Body Dressing/Undressing Lower body dressing      What is the patient wearing?: Pants, Underwear/pull up     Lower body assist Assist for lower body dressing: Set up assist  Toileting Toileting    Toileting assist Assist for toileting: Supervision/Verbal cueing Assistive Device Comment: grab bars   Transfers Chair/bed transfer  Transfers assist     Chair/bed transfer assist level: Supervision/Verbal cueing     Locomotion Ambulation   Ambulation assist   Ambulation activity did not occur: Safety/medical concerns (no platform for R UE with orders to wb thru elbow only)  Assist level: Contact Guard/Touching assist Assistive device: Crutches (1 crutch) Max distance: 20   Walk 10 feet activity   Assist  Walk 10 feet activity did not occur: Safety/medical concerns (no platform for R UE with orders to wb thru elbow only)  Assist level: Contact Guard/Touching assist Assistive  device: Crutches   Walk 50 feet activity   Assist Walk 50 feet with 2 turns activity did not occur: Safety/medical concerns (no platform for R UE with orders to wb thru elbow only)         Walk 150 feet activity   Assist Walk 150 feet activity did not occur: Safety/medical concerns (no platform for R UE with orders to wb thru elbow only)         Walk 10 feet on uneven surface  activity   Assist Walk 10 feet on uneven surfaces activity did not occur: Safety/medical concerns         Wheelchair     Assist Will patient use wheelchair at discharge?: Yes Type of Wheelchair: Manual    Wheelchair assist level: Supervision/Verbal cueing Max wheelchair distance: 200    Wheelchair 50 feet with 2 turns activity    Assist        Assist Level: Supervision/Verbal cueing   Wheelchair 150 feet activity     Assist      Assist Level: Supervision/Verbal cueing   Blood pressure 101/60, pulse 79, temperature 98.4 F (36.9 C), temperature source Oral, resp. rate 16, height 5\' 8"  (1.727 m), weight 62.1 kg, SpO2 100 %.  Medical Problem List and Plan: 1.  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 Impaired mobility -DVT/anticoagulation:  Mechanical:  Continue antiembolism stockings, knee (TED hose) Bilateral lower extremities. Vascular study reviewed and is negative             -antiplatelet therapy: ASA 3. Right brachial plexus injury, myofascial pain: Oxycodone prn             -increased gabapentin to 800mg  TID 6/7---observe today. Pt aware that we have to be careful when titrating meds.              -aspercreme to shoulder, neck             -sling for support, ordered cock up right wrist splint  -continue kpad             -ROM, desensitizing activities in bed,  with therapy  -lidocaine patches for right lower neck as well as right lower ribs, incr to 3  -6/9 added fentanyl patch to assist with generalized pain control    -reviewed Chest CT--no rib fx's seen, pt without any respiratory c/o, sats good. -Continue elavil 10mg  for brachial plexus pain. Titrate as needed/tolerated    -continue gabapentin 800mg  tid as well as  meds/interventions above. 6/11-6/12- will stop lidoderm patches per pt request- pt feels duragesic better for pain and doesn't need lidoderm anymore. 4. Mood: LCSW to follow for evaluation and support.              -antipsychotic agents: N/A 5. Neuropsych: This patient is capable of making decisions on his own behalf. 6. Skin/Wound Care:  Routine pressure relief measures.             --monitor incisions for healing.              -sutures removed by ortho 7. Hyponatremia: 139 6/6 8. Vitamin D Deficiency: On weekly supplement.  9. ABLA: Stable post transfusion: hgb improved to 11 on 6/4, monitor weekly.  10. Dizziness: Has history of vertigo.              --acclimation  -ortho vs negative.  11. Left femoral shaft fx open, left tib-fib shaft fx's  -TTWB LLE with CAM boot  -xrays reviewed and demonstrate early healing. 12. Right acromium fx  -WBAT RUE 13. Constipation  6/11- LBM 1 week ago- had sorbitol yesterday- no results- will give Mg citrate - 1/2 to full bottle- and add senokot 1 tab BID since added Duragesic a couple of days ago.    6/12- pt just finished Mg citrate this AM- no BM yet, but feels like needs to go- if hasn't gone by noon, have team let me know- might need enema.   14. Hypoalbuminema: add juven wound healing  LOS: 10 days A FACE TO FACE EVALUATION WAS PERFORMED  Drema Pry Pippa Hanif 01/18/2021, 3:46 PM

## 2021-01-18 NOTE — Progress Notes (Signed)
Physical Therapy Discharge Summary  Patient Details  Name: Robert Good MRN: [id number] Date of Birth: [birth year]  Today's Date: 01/18/2021 PT Individual Time: 2500-3704 PT Individual Time Calculation (min): 70 min   Skilled Therapeutic Interventions/Progress Updates: Patient in supine and reports L lower leg pressure and vibration in arch.  Noted positive Homan's but RN in to check and reports already on Lovenox injections and she will monitor.  She delivered pain meds while in the room.  Patient also reported some dizziness when doing cervical ROM earlier with OT.  Supine for checking dix hallpike and supine head roll to r/o BPPV.  Patient momentarily symptomatic with L hallpike with bed in trendelenberg, but unable to visualize nystagmus.  No other positions provoked symptoms and repeat to L with asymptomatic.  Patient supine to sit from flat did have initial symptoms with visualizing the table moving.  Returned to supine and educated on using stationary target and pt with decreased symptoms with repeat supine to sit when he performed mod independent.  Patient assisted to don wrist splint, sling and camboot to conserve energy and time.  Transferred to w/c with S squat pivot.  Patient propelled w/c to ortho gym over 200' independently with L UE and R LE.  Patient performed sit to stand to QC for car transfer to 32" height into car simulator with CGA.  Patient lifting leg and CGA to ensure clearing it over ledge.  Patient negotiated 14 steps in stairwell with seated bumping up technique with CGA to get to stair, then continuing up with S.  Patient even scooted around landing as he will need to at home.  Patient ambulated 28' with AC and CGA.  Patient assisted to room in w/c due to 10/10 pain in R UE.  Squat pivot to bed.  Seated for vestibular assessment including oculomotor exam (normal), horizontal head shake test (no nystagmus noted), head thrust test (possible refixation with L head thrust),  seated VOR which provoked symptoms moderately momentarily and scratch test (equal and normal bilateral).  Educated on unilateral vestibular hypofunction and seated VOR for HEP issued written handout to patient.  Left in supine with all needs in reach.  Patient has met 6 of 10 long term goals due to improved activity tolerance, improved balance, increased strength, and ability to compensate for deficits.  Patient to discharge at a wheelchair level Supervision.   Patient's care partner is independent to provide the necessary physical assistance at discharge.  Reasons goals not met: Patient not able to effectively use R UE for weight bearing so unable to stand and ambulate without assistance.  Recommendation:  Patient will benefit from ongoing skilled PT services in home health setting to continue to advance safe functional mobility, address ongoing impairments in balance, LE strength, safety, transfers, endurance, and minimize fall risk.  Equipment: Wheelchair, lap tray, quad cane and crutches  Reasons for discharge: discharge from hospital  Patient/family agrees with progress made and goals achieved: Yes  PT Discharge Precautions/Restrictions Precautions Precautions: Fall Precaution Comments: no active movement/sensation in RUE 2/2 brachial plexus injury Required Braces or Orthoses: Sling;Splint/Cast Splint/Cast: wrist cockup prefab splint RUE; LLE cam boot when OOB Restrictions Weight Bearing Restrictions: Yes RUE Weight Bearing: Weight bear through elbow only LLE Weight Bearing: Touchdown weight bearing  Pain Pain Assessment Pain Scale: 0-10 Pain Score: 8  Pain Type: Acute pain Pain Location: Arm Pain Orientation: Right Pain Descriptors / Indicators: Aching;Burning;Tingling;Shooting Pain Onset: On-going Pain Intervention(s): Repositioned;Rest;Distraction;Emotional support Vision/Perception  Vision - Assessment Additional  Comments: Pt headaches continued through stay and often  closing eyes throughout sessions/squinting. Vertigo vs BPV symptoms noted last session; pt education and f/u recommendations provided. Perception Perception: Within Functional Limits Praxis Praxis: Intact  Cognition Overall Cognitive Status: Within Functional Limits for tasks assessed Arousal/Alertness: Awake/alert Orientation Level: Oriented X4 Attention: Divided Divided Attention: Appears intact Memory: Appears intact Awareness: Appears intact Problem Solving: Appears intact Executive Function: Reasoning Reasoning: Appears intact Safety/Judgment: Appears intact Sensation Sensation Light Touch: Impaired Detail Peripheral sensation comments: decreased R UE due to brachial plexus injury, decreased L LE due to pain, edema and surgical repair Light Touch Impaired Details: Impaired RUE;Impaired LLE Hot/Cold: Impaired by gross assessment Proprioception: Impaired by gross assessment Stereognosis: Impaired by gross assessment Additional Comments: impaired RUE Coordination Gross Motor Movements are Fluid and Coordinated: No Fine Motor Movements are Fluid and Coordinated: No Coordination and Movement Description: RUE paralysis, gross motor limited 2/2 WB restrictions, acute pain Heel Shin Test: NT due to L LE pain Motor  Motor Motor: Other (comment) Motor - Discharge Observations: RUE peripheral nerve injury and no active movement distal to scapula  Mobility Bed Mobility Bed Mobility: Supine to Sit;Sit to Supine;Scooting to [facility] Rolling Right: Independent Rolling Left: Independent Supine to Sit: Independent with assistive device Sit to Supine: Independent with assistive device Scooting to [facility]: Independent Transfers Transfers: Sit to Stand;Stand to Sit;Squat Pivot Transfers Sit to Stand: Contact Guard/Touching assist Stand to Sit: Contact Guard/Touching assist Stand Pivot Transfers: Contact Guard/Touching assist Stand Pivot Transfer Details: Verbal cues for  precautions/safety;Verbal cues for technique Squat Pivot Transfers: Supervision/Verbal cueing Transfer (Assistive device): Crutches (1 crutch under L arm) Locomotion  Gait Ambulation: Yes Gait Assistance: Contact Guard/Touching assist Gait Distance (Feet): 20 Feet Assistive device: Crutches Stairs / Additional Locomotion Stairs: Yes Stairs Assistance: Supervision/Verbal cueing Stair Management Technique: Other (comment);Seated/boosting Number of Stairs: 14 Height of Stairs: 6 Architect: Yes Wheelchair Assistance: Independent with Camera operator: Left upper extremity;Right lower extremity Wheelchair Parts Management: Independent Distance: 200'  Trunk/Postural Assessment  Cervical Assessment Cervical Assessment: Within Functional Limits Thoracic Assessment Thoracic Assessment: Within Functional Limits Lumbar Assessment Lumbar Assessment: Within Functional Limits Postural Control Postural Control: Within Functional Limits  Balance Balance Balance Assessed: Yes Static Sitting Balance Static Sitting - Balance Support: Feet supported;No upper extremity supported Static Sitting - Level of Assistance: 6: Modified independent (Device/Increase time) Dynamic Sitting Balance Dynamic Sitting - Balance Support: Feet supported;During functional activity;No upper extremity supported Dynamic Sitting - Level of Assistance: 6: Modified independent (Device/Increase time) Dynamic Sitting - Balance Activities: Lateral lean/weight shifting;Forward lean/weight shifting;Reaching for objects Static Standing Balance Static Standing - Balance Support: Left upper extremity supported;No upper extremity supported Static Standing - Level of Assistance: 5: Stand by assistance Static Standing - Comment/# of Minutes: during toileting Dynamic Standing Balance Dynamic Standing - Balance Support: Left upper extremity supported;No upper extremity  supported Dynamic Standing - Level of Assistance: 5: Stand by assistance Extremity Assessment      RLE Assessment RLE Assessment: Within Functional Limits LLE Assessment LLE Assessment: Exceptions to [facility] Active Range of Motion (AROM) Comments: Limited knee flexion to about 100 degrees wtih some pain, ankle DF limited to about neutral with some edema General Strength Comments: hip flexion 3/5, knee extension 4-/5, ankle DF 3+/5    Reginia Naas Magda Kiel, PT 01/18/2021, 8:57 AM

## 2021-01-18 NOTE — Plan of Care (Signed)
  Problem: Sit to Stand Goal: LTG:  Patient will perform sit to stand in prep for activites of daily living with assistance level (OT) Description: LTG:  Patient will perform sit to stand in prep for activites of daily living with assistance level (OT) Outcome: Not Met (add Reason) Note: At d/c, pt requires intermittent CGA or close spvsn for sit<>stand due to weightbearing precautions in LLE. Pt able to complete sit<>stand with mod I, but spvsn preferred for safe d/c to home.   Problem: RH Bathing Goal: LTG Patient will bathe all body parts with assist levels (OT) Description: LTG: Patient will bathe all body parts with assist levels (OT) Outcome: Not Met (add Reason) Note: Pt overall requires spvsn for bathing, however, requires min A to bathe LUE due to RUE paralysis at d/c.   Problem: RH Dressing Goal: LTG Patient will perform lower body dressing w/assist (OT) Description: LTG: Patient will perform lower body dressing with assist, with/without cues in positioning using equipment (OT) Outcome: Not Met (add Reason) Note: Pt able to complete at mod I, however would benefit from close spvsn-CGA if completing in stance due to LLE WB precautions.    Problem: RH Balance Goal: LTG Patient will maintain dynamic standing with ADLs (OT) Description: LTG:  Patient will maintain dynamic standing balance with assist during activities of daily living (OT)  Outcome: Completed/Met   Problem: RH Eating Goal: LTG Patient will perform eating w/assist, cues/equip (OT) Description: LTG: Patient will perform eating with assist, with/without cues using equipment (OT) Outcome: Completed/Met   Problem: RH Grooming Goal: LTG Patient will perform grooming w/assist,cues/equip (OT) Description: LTG: Patient will perform grooming with assist, with/without cues using equipment (OT) Outcome: Completed/Met   Problem: RH Dressing Goal: LTG Patient will perform upper body dressing (OT) Description: LTG Patient  will perform upper body dressing with assist, with/without cues (OT). Outcome: Completed/Met   Problem: RH Toileting Goal: LTG Patient will perform toileting task (3/3 steps) with assistance level (OT) Description: LTG: Patient will perform toileting task (3/3 steps) with assistance level (OT)  Outcome: Completed/Met Note: Pt able to complete toileting with mod I using urinal or BSC if transferring from w/c or chair at nonambulatory level; if in stance, pt would benefit from close spvsn-CGA.   Problem: RH Simple Meal Prep Goal: LTG Patient will perform simple meal prep w/assist (OT) Description: LTG: Patient will perform simple meal prep with assistance, with/without cues (OT). Outcome: Completed/Met   Problem: RH Toilet Transfers Goal: LTG Patient will perform toilet transfers w/assist (OT) Description: LTG: Patient will perform toilet transfers with assist, with/without cues using equipment (OT) Outcome: Completed/Met   Problem: RH Tub/Shower Transfers Goal: LTG Patient will perform tub/shower transfers w/assist (OT) Description: LTG: Patient will perform tub/shower transfers with assist, with/without cues using equipment (OT) Outcome: Completed/Met

## 2021-01-18 NOTE — Plan of Care (Signed)
  Problem: RH Balance Goal: LTG Patient will maintain dynamic sitting balance (PT) Description: LTG:  Patient will maintain dynamic sitting balance with assistance during mobility activities (PT) Outcome: Completed/Met Goal: LTG Patient will maintain dynamic standing balance (PT) Description: LTG:  Patient will maintain dynamic standing balance with assistance during mobility activities (PT) Outcome: Not Met (add Reason) Note: Needs S for safety standing dynamic   Problem: Sit to Stand Goal: LTG:  Patient will perform sit to stand with assistance level (PT) Description: LTG:  Patient will perform sit to stand with assistance level (PT) Outcome: Not Met (add Reason) Note: CGA for safety   Problem: RH Bed Mobility Goal: LTG Patient will perform bed mobility with assist (PT) Description: LTG: Patient will perform bed mobility with assistance, with/without cues (PT). Outcome: Completed/Met   Problem: RH Bed to Chair Transfers Goal: LTG Patient will perform bed/chair transfers w/assist (PT) Description: LTG: Patient will perform bed to chair transfers with assistance (PT). Outcome: Not Met (add Reason) Note: S for safety with w/c set up and splint managment   Problem: RH Car Transfers Goal: LTG Patient will perform car transfers with assist (PT) Description: LTG: Patient will perform car transfers with assistance (PT). Outcome: Not Met (add Reason) Note: CGA for safety   Problem: RH Ambulation Goal: LTG Patient will ambulate in controlled environment (PT) Description: LTG: Patient will ambulate in a controlled environment, # of feet with assistance (PT). Outcome: Completed/Met Goal: LTG Patient will ambulate in home environment (PT) Description: LTG: Patient will ambulate in home environment, # of feet with assistance (PT). Outcome: Completed/Met   Problem: RH Wheelchair Mobility Goal: LTG Patient will propel w/c in controlled environment (PT) Description: LTG: Patient will  propel wheelchair in controlled environment, # of feet with assist (PT) Outcome: Completed/Met Goal: LTG Patient will propel w/c in home environment (PT) Description: LTG: Patient will propel wheelchair in home environment, # of feet with assistance (PT). Outcome: Completed/Met   Problem: RH Stairs Goal: LTG Patient will ambulate up and down stairs w/assist (PT) Description: LTG: Patient will ambulate up and down # of stairs with assistance (PT) Outcome: Completed/Met  Magda Kiel, PT
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
## 2021-01-18 NOTE — Progress Notes (Signed)
Inpatient Rehabilitation Care Coordinator Discharge Note  The overall goal for the admission was met for:   Discharge location: Yes-HOME WITH GIRLFRIEND, between she and parent can provide short term supervision  Length of Stay: Yes-11 days  Discharge activity level: Yes-SUPERVISION-MOD/I LEVEL  Home/community participation: Yes  Services provided included: MD, RD, PT, OT, RN, CM, Pharmacy, and SW  Financial Services: Private Insurance: [place]  Choices offered to/list presented to:PT AND GIRLFRIEND  Follow-up services arranged: Home Health: [place], [facility], [facility], [facility] AND [facility], and Patient/Family has no preference for HH/DME agencies  Comments (or additional information):[name] WAS HERE FOR EDUCATION AND IT WENT WELL AND PT WILL HAVE SOMEONE WITH HIM FOR FIRST TWO WEEKS  Patient/Family verbalized understanding of follow-up arrangements: Yes  Individual responsible for coordination of the follow-up plan: [name]-GIRLFRIEND [phone]  Confirmed correct DME delivered: Elease Hashimoto 01/18/2021    Robert Good, Robert Good

## 2021-01-19 DIAGNOSIS — G4700 Insomnia, unspecified: Secondary | ICD-10-CM

## 2021-01-19 DIAGNOSIS — D62 Acute posthemorrhagic anemia: Secondary | ICD-10-CM

## 2021-01-19 DIAGNOSIS — G629 Polyneuropathy, unspecified: Secondary | ICD-10-CM

## 2021-01-19 DIAGNOSIS — D75838 Other thrombocytosis: Secondary | ICD-10-CM

## 2021-01-19 MED ORDER — GABAPENTIN 400 MG PO CAPS: 800.0000 mg | ORAL_CAPSULE | Freq: Three times a day (TID) | ORAL | 0 refills | Status: DC

## 2021-01-19 MED ORDER — FENTANYL 12 MCG/HR TD PT72
1.0000 | MEDICATED_PATCH | TRANSDERMAL | Status: DC
  Administered 2021-01-19: 1 via TRANSDERMAL
  Filled 2021-01-19: qty 1

## 2021-01-19 MED ORDER — LIDOCAINE 5 % EX PTCH: 3.0000 | MEDICATED_PATCH | CUTANEOUS | 0 refills | Status: DC

## 2021-01-19 MED ORDER — ENOXAPARIN SODIUM 30 MG/0.3ML IJ SOSY: 30.0000 mg | PREFILLED_SYRINGE | Freq: Two times a day (BID) | INTRAMUSCULAR | 0 refills | Status: DC

## 2021-01-19 MED ORDER — POTASSIUM CHLORIDE CRYS ER 20 MEQ PO TBCR: 20.0000 meq | EXTENDED_RELEASE_TABLET | Freq: Two times a day (BID) | ORAL | 0 refills | Status: DC

## 2021-01-19 MED ORDER — FENTANYL 12 MCG/HR TD PT72: 1.0000 | MEDICATED_PATCH | TRANSDERMAL | 0 refills | Status: DC

## 2021-01-19 MED ORDER — JUVEN PO PACK: 1.0000 | PACK | Freq: Two times a day (BID) | ORAL | 0 refills | Status: DC

## 2021-01-19 MED ORDER — CYCLOBENZAPRINE HCL 5 MG PO TABS: 5.0000 mg | ORAL_TABLET | Freq: Three times a day (TID) | ORAL | 0 refills | Status: DC

## 2021-01-19 MED ORDER — OXYCODONE HCL 5 MG PO TABS: 5.0000 mg | ORAL_TABLET | Freq: Four times a day (QID) | ORAL | 0 refills | Status: DC | PRN

## 2021-01-19 MED ORDER — TRAZODONE HCL 50 MG PO TABS: 25.0000 mg | ORAL_TABLET | Freq: Every evening | ORAL | 0 refills | Status: DC | PRN

## 2021-01-19 MED ORDER — AMITRIPTYLINE HCL 10 MG PO TABS: 10.0000 mg | ORAL_TABLET | Freq: Every day | ORAL | 0 refills | Status: DC

## 2021-01-19 MED ORDER — SENNA 8.6 MG PO TABS: 1.0000 | ORAL_TABLET | Freq: Two times a day (BID) | ORAL | 0 refills | Status: DC

## 2021-01-19 MED ORDER — PANTOPRAZOLE SODIUM 40 MG PO TBEC: 40.0000 mg | DELAYED_RELEASE_TABLET | Freq: Every day | ORAL | 0 refills | Status: DC

## 2021-01-19 NOTE — Progress Notes (Signed)
PROGRESS NOTE   Subjective/Complaints: Pt is asking what surgeries he had and asking where his meds are going.  [facility] [place] so meds cannot be filled here.     ROS:    Pt denies SOB, abd pain, CP, N/V/C/D, and vision changes  Objective:   No results found. Recent Labs    01/18/21 0545  WBC 3.1*  HGB 11.4*  HCT 34.9*  PLT 450*    Recent Labs    01/18/21 0545  NA 138  K 4.1  CL 100  CO2 32  GLUCOSE 101*  BUN 19  CREATININE 1.20  CALCIUM 9.8     Intake/Output Summary (Last 24 hours) at 01/19/2021 0848 Last data filed at 01/19/2021 0600 Gross per 24 hour  Intake 400 ml  Output 700 ml  Net -300 ml        Physical Exam: Vital Signs Blood pressure 111/62, pulse 76, temperature 98.6 F (37 C), temperature source Oral, resp. rate 16, height 5\' 8"  (1.727 m), weight 62.1 kg, SpO2 100 %.    General: awake, alert, appropriate, GF at bedside; NAD HENT: conjugate gaze; oropharynx moist CV: regular rate; no JVD Pulmonary: CTA B/L; no W/R/R- good air movement GI: soft, NT, ND, (+)BS Psychiatric: appropriate; asking questions that had already been discussed Neurological: alert  Ext: no clubbing, cyanosis, or edema; RUE with L tape noted- For brachial plexus injury.  Psych: pleasant and cooperative Musculoskeletal:        right neck tightness, right rib cage tender along lower half, axillary line  Skin: left posterior thigh incision and abrasions on left shin, area clearing up. RUE forearm lacerations/abrasions with scab. Area in right axilla with steristrips. All areas clean and dry    Neurological:     Mental Status: He is alert and oriented to person, place, and time. Normal cognition    Cranial Nerves: No cranial nerve deficit.     Comments: Alert and oriented x 3. Normal insight and awareness. Intact Memory. Normal language and speech. Cranial nerve exam unremarkable . RUE with trace deltoid  movement, otherwise remains 0/5. LUE 5/5. RLE 4/5. LLE 3 to 4/5 with pain inhibition. Sensation 0/2 RUE to LT middle and distally. Does sense gross touch, has dysesthesias more proximally in arm than distally. No changes 6/10    Assessment/Plan: 1. Functional deficits which require 3+ hours per day of interdisciplinary therapy in a comprehensive inpatient rehab setting. Physiatrist is providing close team supervision and 24 hour management of active medical problems listed below. Physiatrist and rehab team continue to assess barriers to discharge/monitor patient progress toward functional and medical goals  Care Tool:  Bathing    Body parts bathed by patient: Right arm, Chest, Abdomen, Front perineal area, Right upper leg, Buttocks, Left upper leg, Right lower leg, Left lower leg, Face   Body parts bathed by helper: Left arm     Bathing assist Assist Level: Minimal Assistance - Patient > 75%     Upper Body Dressing/Undressing Upper body dressing Upper body dressing/undressing activity did not occur (including orthotics): Safety/medical concerns What is the patient wearing?: Pull over shirt, Orthosis Orthosis activity level: Performed by patient (orthosis  and sling)  Upper body assist Assist Level: Independent with assistive device    Lower Body Dressing/Undressing Lower body dressing      What is the patient wearing?: Pants, Underwear/pull up     Lower body assist Assist for lower body dressing: Set up assist     Toileting Toileting    Toileting assist Assist for toileting: Supervision/Verbal cueing Assistive Device Comment: grab bars   Transfers Chair/bed transfer  Transfers assist     Chair/bed transfer assist level: Supervision/Verbal cueing     Locomotion Ambulation   Ambulation assist   Ambulation activity did not occur: Safety/medical concerns (no platform for R UE with orders to wb thru elbow only)  Assist level: Contact Guard/Touching  assist Assistive device: Crutches (1 crutch) Max distance: 20   Walk 10 feet activity   Assist  Walk 10 feet activity did not occur: Safety/medical concerns (no platform for R UE with orders to wb thru elbow only)  Assist level: Contact Guard/Touching assist Assistive device: Crutches   Walk 50 feet activity   Assist Walk 50 feet with 2 turns activity did not occur: Safety/medical concerns (no platform for R UE with orders to wb thru elbow only)         Walk 150 feet activity   Assist Walk 150 feet activity did not occur: Safety/medical concerns (no platform for R UE with orders to wb thru elbow only)         Walk 10 feet on uneven surface  activity   Assist Walk 10 feet on uneven surfaces activity did not occur: Safety/medical concerns         Wheelchair     Assist Will patient use wheelchair at discharge?: Yes Type of Wheelchair: Manual    Wheelchair assist level: Supervision/Verbal cueing Max wheelchair distance: 200    Wheelchair 50 feet with 2 turns activity    Assist        Assist Level: Supervision/Verbal cueing   Wheelchair 150 feet activity     Assist      Assist Level: Supervision/Verbal cueing   Blood pressure 111/62, pulse 76, temperature 98.6 F (37 C), temperature source Oral, resp. rate 16, height 5\' 8"  (1.727 m), weight 62.1 kg, SpO2 100 %.  Medical Problem List and Plan: 1.  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  2. Impaired mobility -DVT/anticoagulation:  Mechanical:  Continue antiembolism stockings, knee (TED hose) Bilateral lower extremities. Vascular study reviewed and is negative             -antiplatelet therapy: ASA 3. Right brachial plexus injury, myofascial pain: Oxycodone prn             -increased  gabapentin to 800mg  TID 6/7---observe today. Pt aware that we have to be careful when titrating meds.              -aspercreme to shoulder, neck             -sling for support, ordered cock up right wrist splint  -continue kpad             -ROM, desensitizing activities in bed, with therapy  -lidocaine patches for right lower neck as well as right lower  ribs, incr to 3  -6/9 added fentanyl patch to assist with generalized pain control    -reviewed Chest CT--no rib fx's seen, pt without any respiratory c/o, sats good. -Continue elavil 10mg  for brachial plexus pain. Titrate as needed/tolerated    -continue gabapentin 800mg  tid as well as meds/interventions above. 6/11-6/12- will stop lidoderm patches per pt request- pt feels duragesic better for pain and doesn't need lidoderm anymore.  6/14- [facility]  4. Mood: LCSW to follow for evaluation and support.              -antipsychotic agents: N/A 5. Neuropsych: This patient is capable of making decisions on his own behalf. 6. Skin/Wound Care:  Routine pressure relief measures.             --monitor incisions for healing.              -sutures removed by ortho 7. Hyponatremia: 139 6/6 8. Vitamin D Deficiency: On weekly supplement.  9. ABLA: Stable post transfusion: hgb improved to 11 on 6/4, monitor weekly.  10. Dizziness: Has history of vertigo.              --acclimation  -ortho vs negative.  11. Left femoral shaft fx open, left tib-fib shaft fx's  -TTWB LLE with CAM boot  -xrays reviewed and demonstrate early healing. 12. Right acromium fx  -WBAT RUE 13. Constipation  6/11- LBM 1 week ago- had sorbitol yesterday- no results- will give Mg citrate - 1/2 to full bottle- and add senokot 1 tab BID since added Duragesic a couple of days ago.    6/12- pt just finished Mg citrate this AM- no BM yet, but feels like needs to go- if hasn't gone by noon, have team let me know- might need enema.   6/14- LBM 6/12 after mg  citrate- medium- still needs to go more- but can do at home.   14. Hypoalbuminema: add juven wound healing  LOS: 11 days A FACE TO FACE EVALUATION WAS PERFORMED  Robert Good 01/19/2021, 8:48 AM

## 2021-01-19 NOTE — Progress Notes (Signed)
Patient ID: Robert Good, male   DOB: 1991-03-29, 30 y.o.   MRN: [id number]  Met with pt and girlfriend to discuss no equipment at the home yet. Spoke with cathy-[facility] who is checking with [employer] and trying to get a tracking number for. Pt feels with the crutches he can get into the home until the equipment is delivered. Discussed the medications they have not shown up yet either. They want to have [place] to fill meds and then will have some at discharge until others are delivered. Pam-PA aware and will try to get filled unsure if pharmacy has a [place] contract. Will try and see. Plan for DC home today.
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
## 2021-01-19 NOTE — Progress Notes (Signed)
INPATIENT REHABILITATION DISCHARGE NOTE   Discharge instructions by: Delle Reining  Verbalized understanding: Yes  Skin care/Wound care: Dressings changed 01/19/21  Pain: Fentanyl patch replaced per PA  IV's: none  Tubes/Drains: none  Safety instructions:   Patient belongings: collected by spouse  Discharged to: home  Discharged via: family  Notes: Lovenox administration reviewed with spouse by RN.

## 2021-01-20 ENCOUNTER — Telehealth: Payer: Self-pay

## 2021-01-20 ENCOUNTER — Encounter: Payer: Self-pay | Admitting: Registered Nurse

## 2021-01-20 NOTE — Telephone Encounter (Signed)
Robert Good from [facility] PT called to ask about arm restrictions on patient s/p subclavian artery repair on 12/25/20. Discussed with PA. [name] patient had no restrictions from a vascular standpoint.

## 2021-01-21 ENCOUNTER — Telehealth: Payer: Self-pay

## 2021-01-21 NOTE — Telephone Encounter (Signed)
Transitional Care Call--who you spoke with The patient .   Are you/is patient experiencing any problems since coming home? Are there any questions regarding any aspect of care? Pt is not experiencing any problems . Are there any questions regarding medications administration/dosing? Are meds being taken as prescribed? Patient should review meds with caller to confirm . No pt states hes taking all the medications that were  given to him in the hospital . Have there been any falls? No. Has Home Health been to the house and/or have they contacted you? If not, have you tried to contact them? Can we help you contact them? Yes they will get him a wheelchair on Monday because it was on backorder. Are bowels and bladder emptying properly? Are there any unexpected incontinence issues? If applicable, is patient following bowel/bladder programs? Yes got prescribed medication at first to use bathroom but now is using bathroom okay. Any fevers, problems with breathing, unexpected pain? No Are there any skin problems or new areas of breakdown?No Has the patient/family member arranged specialty MD follow up (ie cardiology/neurology/renal/surgical/etc)?  Can we help arrange? Yes. Does the patient need any other services or support that we can help arrange? No Are caregivers following through as expected in assisting the patient? Yes         11. Has the patient quit smoking, drinking alcohol, or using drugs as recommended? Pt is not smoker or drinker.  Appointment Date 02/03/2021 Time 1:00pm / Arrival time 12:40 pm / and who they are seeing NP Jacalyn Lefevre  [address]

## 2021-02-03 ENCOUNTER — Encounter: Payer: No Typology Code available for payment source | Attending: Registered Nurse | Admitting: Registered Nurse

## 2021-02-03 ENCOUNTER — Other Ambulatory Visit: Payer: Self-pay

## 2021-02-03 VITALS — BP 109/76 | HR 88 | Temp 98.7°F | Ht 68.0 in | Wt 115.0 lb

## 2021-02-03 DIAGNOSIS — G4709 Other insomnia: Secondary | ICD-10-CM | POA: Diagnosis not present

## 2021-02-03 DIAGNOSIS — G8918 Other acute postprocedural pain: Secondary | ICD-10-CM | POA: Diagnosis not present

## 2021-02-03 DIAGNOSIS — G629 Polyneuropathy, unspecified: Secondary | ICD-10-CM | POA: Insufficient documentation

## 2021-02-03 DIAGNOSIS — S143XXD Injury of brachial plexus, subsequent encounter: Secondary | ICD-10-CM | POA: Diagnosis not present

## 2021-02-03 MED ORDER — CYCLOBENZAPRINE HCL 5 MG PO TABS: 5.0000 mg | ORAL_TABLET | Freq: Three times a day (TID) | ORAL | 1 refills | Status: DC

## 2021-02-03 MED ORDER — OXYCODONE HCL 10 MG PO TABS: 10.0000 mg | ORAL_TABLET | Freq: Four times a day (QID) | ORAL | 0 refills | Status: DC | PRN

## 2021-02-03 MED ORDER — TRAZODONE HCL 50 MG PO TABS: 25.0000 mg | ORAL_TABLET | Freq: Every evening | ORAL | 1 refills | Status: DC | PRN

## 2021-02-03 MED ORDER — GABAPENTIN 400 MG PO CAPS: 800.0000 mg | ORAL_CAPSULE | Freq: Four times a day (QID) | ORAL | 1 refills | Status: DC

## 2021-02-03 MED ORDER — AMITRIPTYLINE HCL 10 MG PO TABS: 10.0000 mg | ORAL_TABLET | Freq: Every day | ORAL | 1 refills | Status: DC

## 2021-02-03 MED ORDER — OXYCODONE HCL 5 MG PO TABS: 5.0000 mg | ORAL_TABLET | Freq: Four times a day (QID) | ORAL | 0 refills | Status: DC | PRN
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
## 2021-02-04 ENCOUNTER — Emergency Department (HOSPITAL_COMMUNITY)
Admission: EM | Admit: 2021-02-04 | Discharge: 2021-02-05 | Disposition: A | Discharge status: Home / Self Care | Payer: No Typology Code available for payment source | Attending: Emergency Medicine | Admitting: Emergency Medicine

## 2021-02-04 ENCOUNTER — Emergency Department (HOSPITAL_COMMUNITY): Payer: No Typology Code available for payment source

## 2021-02-04 DIAGNOSIS — R202 Paresthesia of skin: Secondary | ICD-10-CM | POA: Diagnosis not present

## 2021-02-04 DIAGNOSIS — M25511 Pain in right shoulder: Secondary | ICD-10-CM | POA: Diagnosis present

## 2021-02-04 DIAGNOSIS — Z7982 Long term (current) use of aspirin: Secondary | ICD-10-CM | POA: Insufficient documentation

## 2021-02-04 DIAGNOSIS — G8929 Other chronic pain: Secondary | ICD-10-CM | POA: Diagnosis not present

## 2021-02-04 LAB — CBC WITH DIFFERENTIAL/PLATELET
Abs Immature Granulocytes: 0.01 10*3/uL (ref 0.00–0.07)
Basophils Absolute: 0 10*3/uL (ref 0.0–0.1)
Basophils Relative: 0 %
Eosinophils Absolute: 0.1 10*3/uL (ref 0.0–0.5)
Eosinophils Relative: 1 %
HCT: 40.3 % (ref 39.0–52.0)
Hemoglobin: 12.9 g/dL — ABNORMAL LOW (ref 13.0–17.0)
Immature Granulocytes: 0 %
Lymphocytes Relative: 24 %
Lymphs Abs: 1.1 10*3/uL (ref 0.7–4.0)
MCH: 30.6 pg (ref 26.0–34.0)
MCHC: 32 g/dL (ref 30.0–36.0)
MCV: 95.7 fL (ref 80.0–100.0)
Monocytes Absolute: 0.4 10*3/uL (ref 0.1–1.0)
Monocytes Relative: 8 %
Neutro Abs: 3.1 10*3/uL (ref 1.7–7.7)
Neutrophils Relative %: 67 %
Platelets: 325 10*3/uL (ref 150–400)
RBC: 4.21 MIL/uL — ABNORMAL LOW (ref 4.22–5.81)
RDW: 13.3 % (ref 11.5–15.5)
WBC: 4.7 10*3/uL (ref 4.0–10.5)
nRBC: 0 % (ref 0.0–0.2)

## 2021-02-04 LAB — BASIC METABOLIC PANEL
Anion gap: 9 (ref 5–15)
BUN: 12 mg/dL (ref 6–20)
CO2: 29 mmol/L (ref 22–32)
Calcium: 10 mg/dL (ref 8.9–10.3)
Chloride: 94 mmol/L — ABNORMAL LOW (ref 98–111)
Creatinine, Ser: 1.31 mg/dL — ABNORMAL HIGH (ref 0.61–1.24)
GFR, Estimated: >60 mL/min (ref >60)
Glucose, Bld: 94 mg/dL (ref 70–99)
Potassium: 3.7 mmol/L (ref 3.5–5.1)
Sodium: 132 mmol/L — ABNORMAL LOW (ref 135–145)
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
## 2021-02-04 NOTE — ED Notes (Signed)
Pt waiting outside

## 2021-02-05 MED ORDER — OXYCODONE-ACETAMINOPHEN 5-325 MG PO TABS
2.0000 | ORAL_TABLET | Freq: Once | ORAL | Status: AC
Start: 2021-02-05 — End: 2021-02-05
  Administered 2021-02-05: 2 via ORAL
  Filled 2021-02-05: qty 2

## 2021-02-05 MED ORDER — OXYCODONE-ACETAMINOPHEN 5-325 MG PO TABS: 1.0000 | ORAL_TABLET | Freq: Once | ORAL | Status: DC

## 2021-02-05 MED ORDER — METHOCARBAMOL 500 MG PO TABS
500.0000 mg | ORAL_TABLET | Freq: Once | ORAL | Status: AC
  Administered 2021-02-05: 500 mg via ORAL
  Filled 2021-02-05: qty 1

## 2021-02-05 NOTE — Discharge Instructions (Addendum)
Continue use of your prescribed medications.  You may benefit from use of topical diclofenac gel/Voltaren gel.  This can be purchased at your local pharmacy.  Apply twice a day.  Continue follow-up with Dr. Dierdre Searles of orthopedics at [facility].  Return for new or concerning symptoms.
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
## 2021-02-10 ENCOUNTER — Ambulatory Visit (INDEPENDENT_AMBULATORY_CARE_PROVIDER_SITE_OTHER): Payer: No Typology Code available for payment source | Admitting: Vascular Surgery

## 2021-02-10 ENCOUNTER — Encounter: Payer: Self-pay | Admitting: Vascular Surgery

## 2021-02-10 ENCOUNTER — Ambulatory Visit (HOSPITAL_COMMUNITY)
Admission: RE | Admit: 2021-02-10 | Discharge: 2021-02-10 | Disposition: A | Discharge status: Home / Self Care | Payer: No Typology Code available for payment source | Source: Ambulatory Visit | Attending: Vascular Surgery | Admitting: Vascular Surgery

## 2021-02-10 ENCOUNTER — Other Ambulatory Visit: Payer: Self-pay

## 2021-02-10 ENCOUNTER — Encounter: Payer: Self-pay | Admitting: Registered Nurse

## 2021-02-10 VITALS — BP 111/74 | HR 74 | Temp 97.7°F | Resp 10 | Ht 68.0 in | Wt 115.0 lb

## 2021-02-10 DIAGNOSIS — S25101D Unspecified injury of right innominate or subclavian artery, subsequent encounter: Secondary | ICD-10-CM | POA: Diagnosis not present
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
## 2021-03-30 ENCOUNTER — Telehealth: Payer: Self-pay | Admitting: Registered Nurse

## 2021-03-30 ENCOUNTER — Telehealth: Payer: Self-pay | Admitting: *Deleted

## 2021-03-30 ENCOUNTER — Other Ambulatory Visit: Payer: Self-pay

## 2021-03-30 MED ORDER — AMITRIPTYLINE HCL 10 MG PO TABS: 10.0000 mg | ORAL_TABLET | Freq: Every day | ORAL | 1 refills | Status: DC

## 2021-03-30 MED ORDER — GABAPENTIN 400 MG PO CAPS: 800.0000 mg | ORAL_CAPSULE | Freq: Four times a day (QID) | ORAL | 1 refills | Status: AC

## 2021-03-30 MED ORDER — CYCLOBENZAPRINE HCL 5 MG PO TABS: 5.0000 mg | ORAL_TABLET | Freq: Three times a day (TID) | ORAL | 1 refills | Status: DC

## 2021-03-30 MED ORDER — OXYCODONE HCL 5 MG PO TABS: 5.0000 mg | ORAL_TABLET | Freq: Three times a day (TID) | ORAL | 0 refills | Status: DC | PRN

## 2021-03-30 MED ORDER — GABAPENTIN 400 MG PO CAPS: 800.0000 mg | ORAL_CAPSULE | Freq: Four times a day (QID) | ORAL | 1 refills | Status: DC

## 2021-03-30 NOTE — Telephone Encounter (Signed)
Placed a call to Mr. Robert Good,  When he was discharged in June he was dispense 14 tablets of potassium. He has a scheduled appointment with his PCP on 03/31/2021.  Gabapentin, Flexeril, Amitriptyline and Oxycodone e-scribed to [facility]. [name] is aware of the above.

## 2021-03-30 NOTE — Telephone Encounter (Signed)
Mr Medinger called for Robert Good to refill his medications.  He did not specify.  He has not had his follow up with Dr Riley Kill (it is 04/14/21).

## 2021-03-30 NOTE — Telephone Encounter (Signed)
Robert Good came to the office stating the [facility] in [place] stated he wouldn't be able to pick up his prescriptions today, unless they are printed.  [facility], with comment to omit e-scribe prescriptions.  Spoke with Pharmacy technician she reports Robert Good doesn't have a authorization form. I placed a call to Robert Good, [facility], he verbalizes understanding.

## 2021-03-30 NOTE — Telephone Encounter (Signed)
Refill request:    Oxycodone

## 2021-03-30 NOTE — Telephone Encounter (Signed)
This provider sent Mr. Robert Good a My-Chart message for clarification of his medication request refill.

## 2021-04-14 ENCOUNTER — Other Ambulatory Visit: Payer: Self-pay

## 2021-04-14 ENCOUNTER — Encounter: Attending: Registered Nurse | Admitting: Physical Medicine & Rehabilitation

## 2021-04-14 ENCOUNTER — Encounter: Payer: Self-pay | Admitting: Physical Medicine & Rehabilitation

## 2021-04-14 VITALS — BP 132/84 | HR 107 | Temp 98.5°F | Ht 68.0 in | Wt 119.8 lb

## 2021-04-14 DIAGNOSIS — S143XXS Injury of brachial plexus, sequela: Secondary | ICD-10-CM | POA: Insufficient documentation

## 2021-04-14 DIAGNOSIS — M792 Neuralgia and neuritis, unspecified: Secondary | ICD-10-CM | POA: Insufficient documentation

## 2021-04-14 MED ORDER — DULOXETINE HCL 20 MG PO CPEP: 20.0000 mg | ORAL_CAPSULE | Freq: Every day | ORAL | 2 refills | Status: DC

## 2021-04-14 MED ORDER — AMITRIPTYLINE HCL 25 MG PO TABS: 25.0000 mg | ORAL_TABLET | Freq: Every day | ORAL | 3 refills | Status: DC

## 2021-04-14 NOTE — Patient Instructions (Signed)
PLEASE FEEL FREE TO CALL OUR OFFICE WITH ANY PROBLEMS OR QUESTIONS ([phone])      
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
## 2021-04-22 ENCOUNTER — Other Ambulatory Visit: Payer: Self-pay | Admitting: Physical Medicine & Rehabilitation

## 2021-04-22 DIAGNOSIS — M792 Neuralgia and neuritis, unspecified: Secondary | ICD-10-CM

## 2021-04-22 DIAGNOSIS — S143XXS Injury of brachial plexus, sequela: Secondary | ICD-10-CM

## 2021-07-14 ENCOUNTER — Encounter: Payer: No Typology Code available for payment source | Admitting: Physical Medicine & Rehabilitation

## 2022-03-10 IMAGING — DX DG FEMUR 2+V*L*
4 series · 4 of 4 positions shown · non-contrast
Comparison: December 25, 2020.

CLINICAL DATA: Left femoral fracture.

EXAM:
LEFT FEMUR 2 VIEWS

[femur ap (1 of 2)]
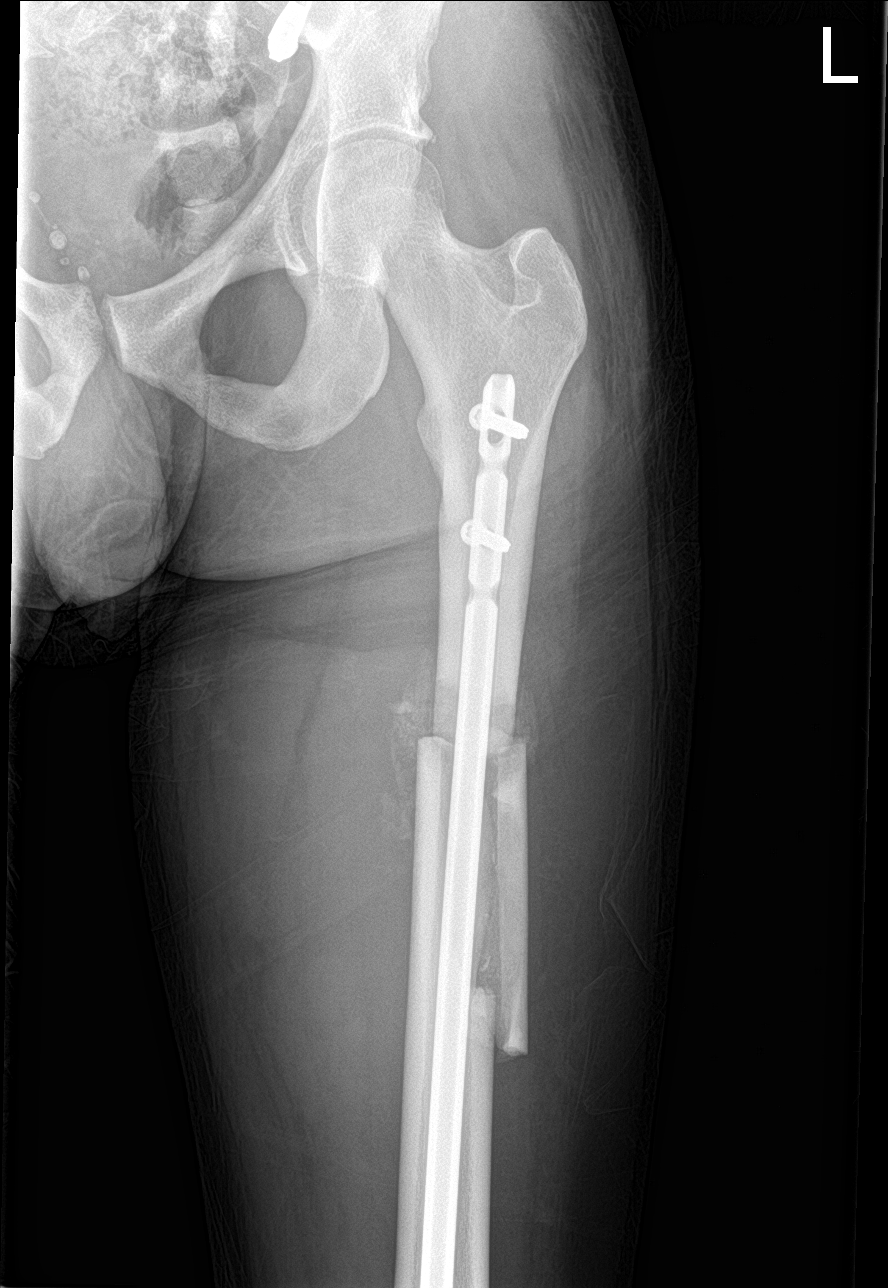

[femur ap (2 of 2)]
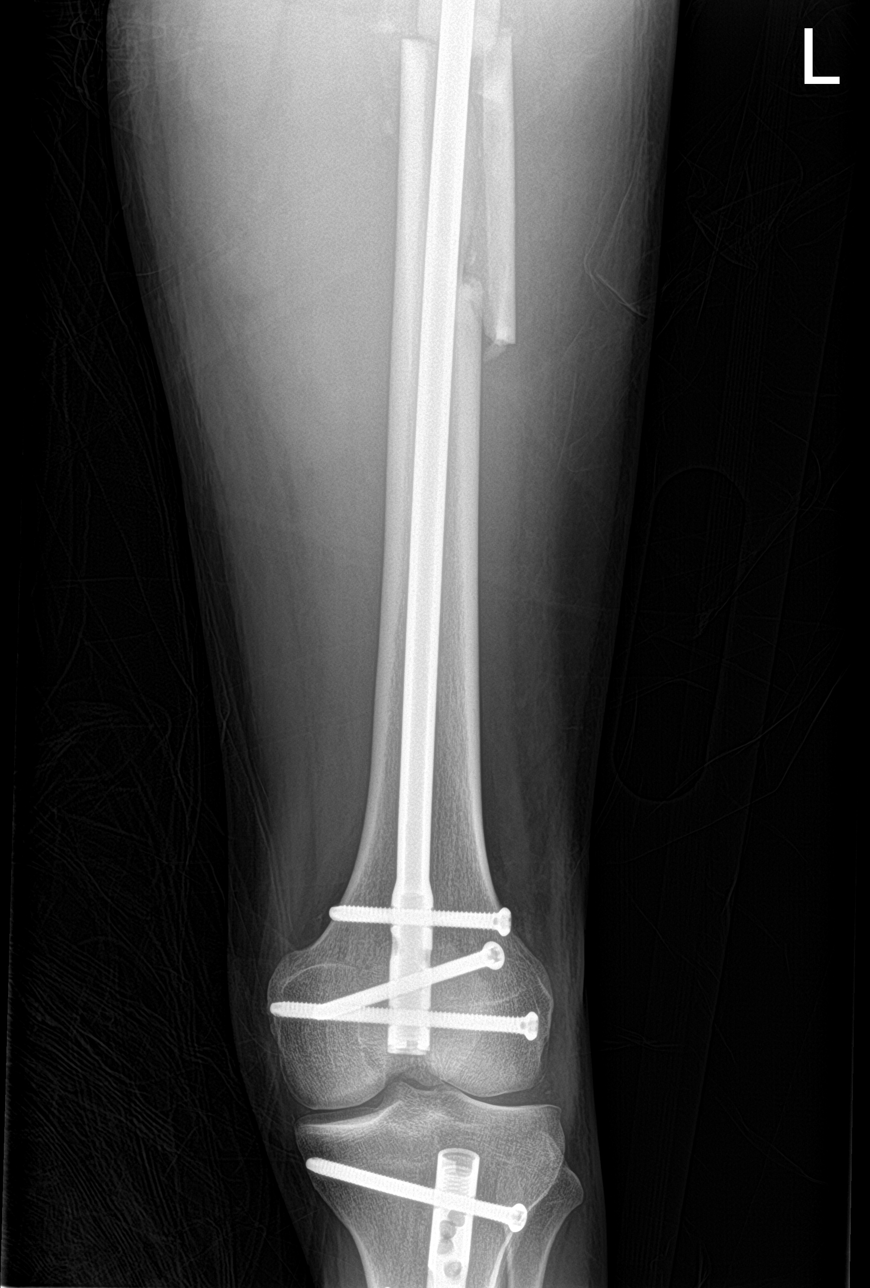

[femur lat (1 of 2)]
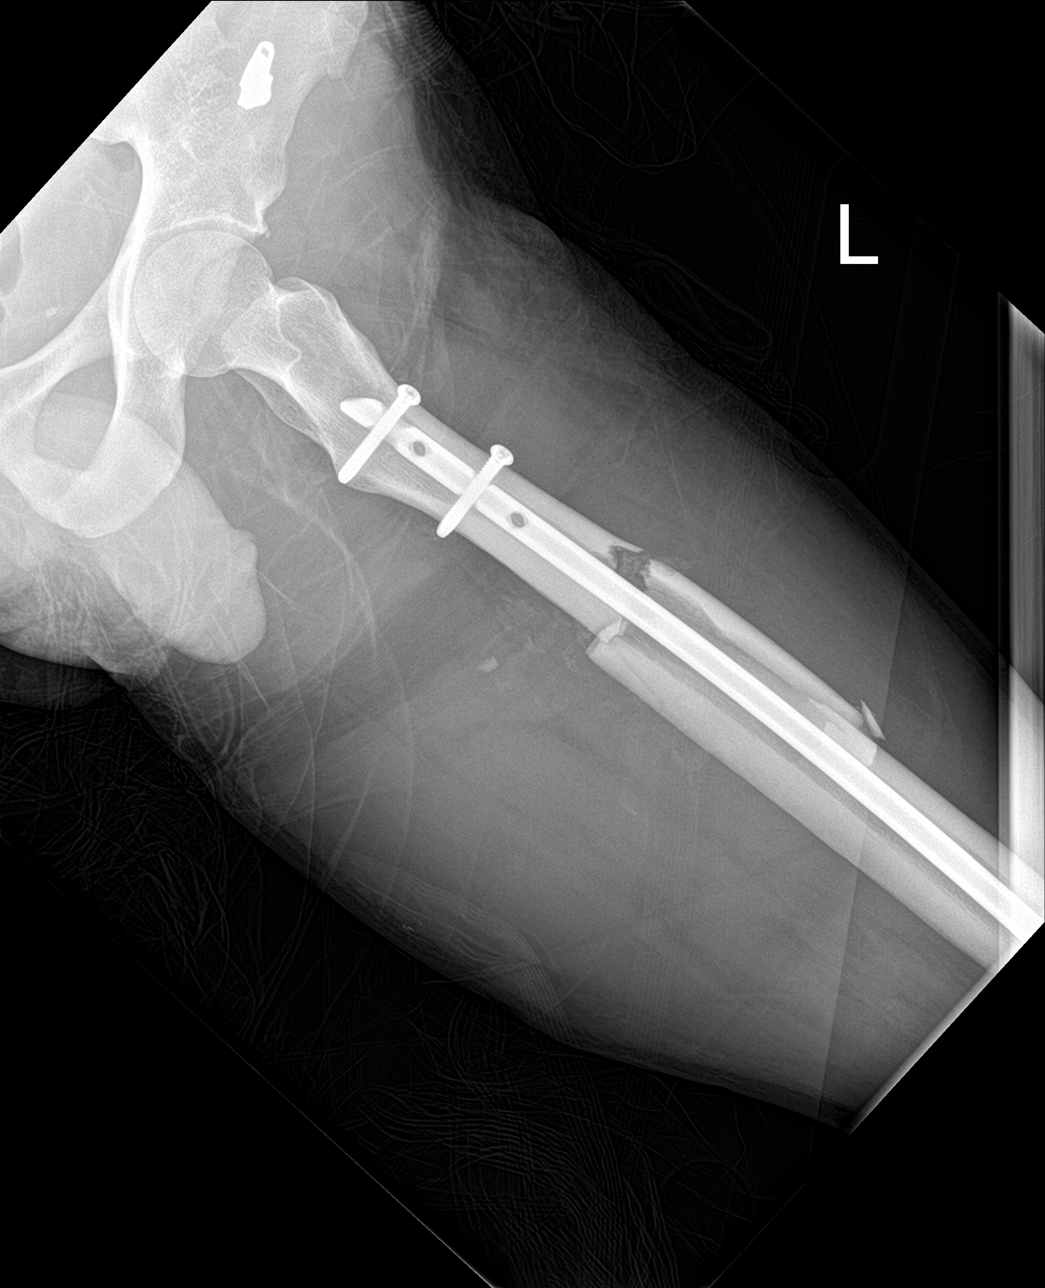

[femur lat (2 of 2)]
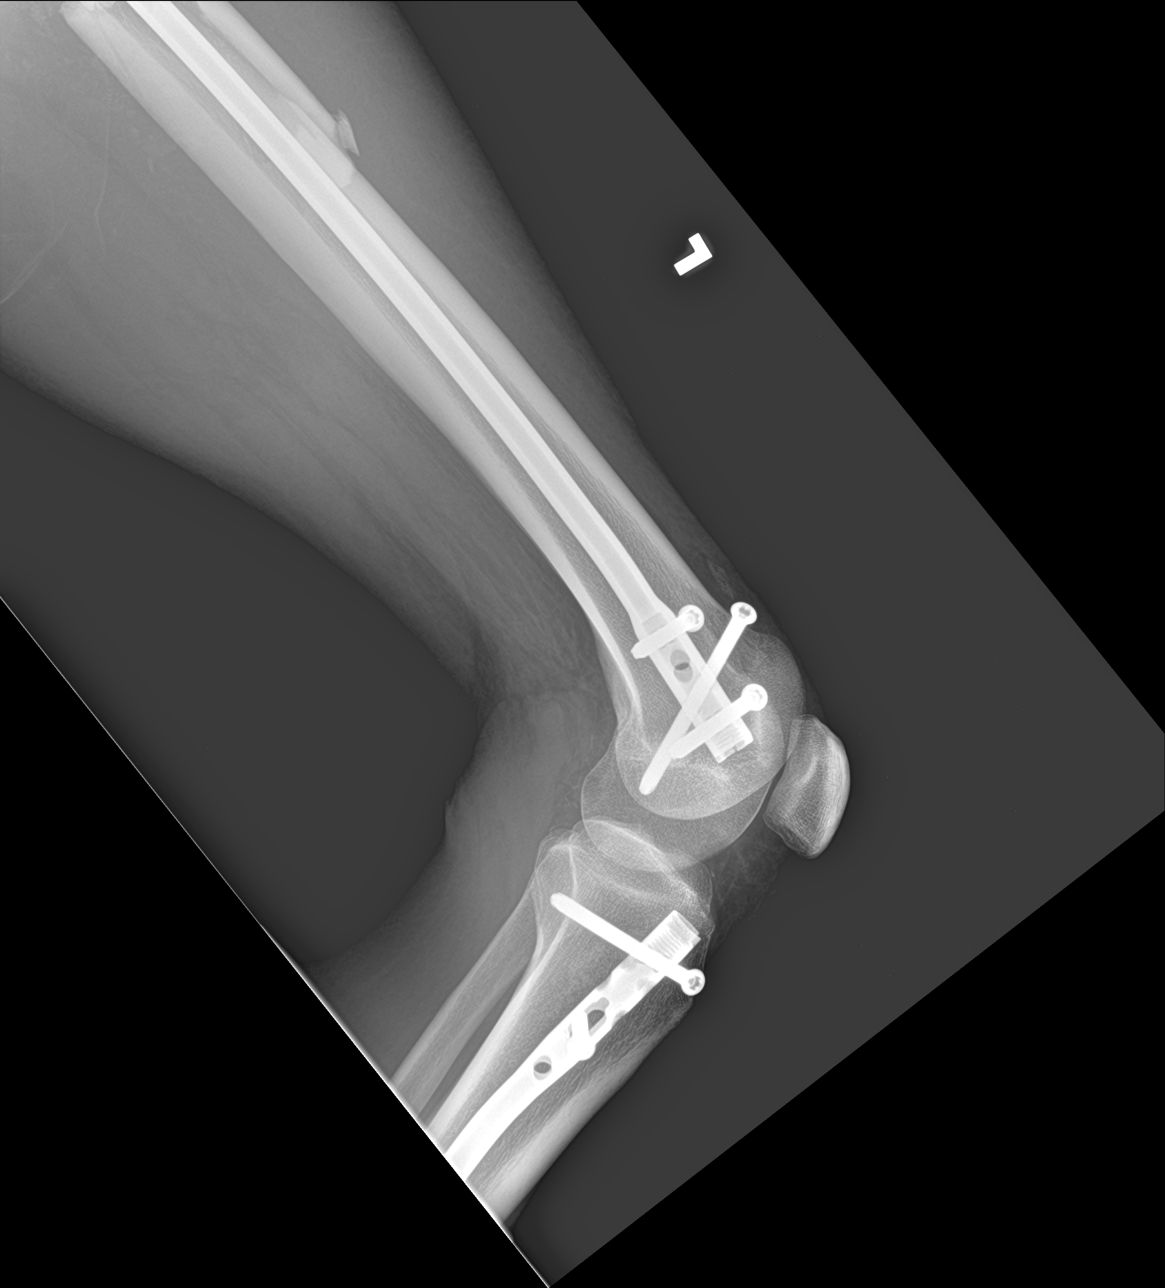

[4 of 4 positions shown; findings below may reference images not displayed]

FINDINGS: Status post intramedullary rod fixation of comminuted femoral shaft
fracture. Alignment of fracture components is unchanged compared to
prior exam. Some callus formation is noted around the fracture
suggesting some healing, although persistent fracture lines remain.
IMPRESSION: Status post intramedullary rod fixation of comminuted left femoral
shaft fracture. Some callus formation is noted suggesting some
healing.

## 2022-06-15 ENCOUNTER — Other Ambulatory Visit: Payer: Self-pay | Admitting: *Deleted

## 2022-06-15 DIAGNOSIS — S25101D Unspecified injury of right innominate or subclavian artery, subsequent encounter: Secondary | ICD-10-CM

## 2022-06-23 ENCOUNTER — Ambulatory Visit (HOSPITAL_COMMUNITY)
Admission: RE | Admit: 2022-06-23 | Discharge: 2022-06-23 | Disposition: A | Discharge status: Home / Self Care | Source: Ambulatory Visit | Attending: Vascular Surgery | Admitting: Vascular Surgery

## 2022-06-23 ENCOUNTER — Encounter: Payer: Self-pay | Admitting: Vascular Surgery

## 2022-06-23 ENCOUNTER — Ambulatory Visit (INDEPENDENT_AMBULATORY_CARE_PROVIDER_SITE_OTHER): Admitting: Vascular Surgery

## 2022-06-23 VITALS — BP 114/78 | HR 67 | Temp 98.5°F | Resp 20 | Ht 68.0 in | Wt 128.0 lb

## 2022-06-23 DIAGNOSIS — S25101D Unspecified injury of right innominate or subclavian artery, subsequent encounter: Secondary | ICD-10-CM | POA: Insufficient documentation
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
## 2022-06-27 ENCOUNTER — Other Ambulatory Visit: Payer: Self-pay

## 2022-06-27 DIAGNOSIS — S25101D Unspecified injury of right innominate or subclavian artery, subsequent encounter: Secondary | ICD-10-CM

## 2023-01-26 ENCOUNTER — Ambulatory Visit (HOSPITAL_COMMUNITY)
Admission: RE | Admit: 2023-01-26 | Discharge: 2023-01-26 | Disposition: A | Discharge status: Home / Self Care | Payer: No Typology Code available for payment source | Source: Ambulatory Visit | Attending: Vascular Surgery | Admitting: Vascular Surgery

## 2023-01-26 ENCOUNTER — Encounter: Payer: Self-pay | Admitting: Vascular Surgery

## 2023-01-26 ENCOUNTER — Ambulatory Visit: Payer: No Typology Code available for payment source | Admitting: Vascular Surgery

## 2023-01-26 VITALS — BP 136/93 | HR 68 | Temp 97.6°F | Resp 97 | Wt 122.0 lb

## 2023-01-26 DIAGNOSIS — S25101D Unspecified injury of right innominate or subclavian artery, subsequent encounter: Secondary | ICD-10-CM | POA: Insufficient documentation
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
## 2023-01-30 ENCOUNTER — Telehealth: Payer: Self-pay

## 2023-01-30 NOTE — Telephone Encounter (Signed)
Attempted to reach patient to schedule procedure. Left VM for patient to return call.

## 2023-01-31 ENCOUNTER — Other Ambulatory Visit: Payer: Self-pay

## 2023-01-31 DIAGNOSIS — S25101D Unspecified injury of right innominate or subclavian artery, subsequent encounter: Secondary | ICD-10-CM

## 2023-01-31 NOTE — Telephone Encounter (Signed)
Spoke with patient and scheduled procedure for 7/15. Instructions provided and he verbalized understanding.

## 2023-02-20 ENCOUNTER — Encounter (HOSPITAL_COMMUNITY)
Admission: RE | Disposition: A | Discharge status: Home / Self Care | Payer: Self-pay | Source: Home / Self Care | Attending: Vascular Surgery

## 2023-02-20 ENCOUNTER — Other Ambulatory Visit: Payer: Self-pay

## 2023-02-20 ENCOUNTER — Ambulatory Visit (HOSPITAL_COMMUNITY)
Admission: RE | Admit: 2023-02-20 | Discharge: 2023-02-20 | Disposition: A | Discharge status: Home / Self Care | Payer: No Typology Code available for payment source | Attending: Vascular Surgery | Admitting: Vascular Surgery

## 2023-02-20 DIAGNOSIS — S45801D Unspecified injury of other specified blood vessels at shoulder and upper arm level, right arm, subsequent encounter: Secondary | ICD-10-CM | POA: Insufficient documentation

## 2023-02-20 DIAGNOSIS — T82898A Other specified complication of vascular prosthetic devices, implants and grafts, initial encounter: Secondary | ICD-10-CM

## 2023-02-20 DIAGNOSIS — Z7982 Long term (current) use of aspirin: Secondary | ICD-10-CM | POA: Diagnosis not present

## 2023-02-20 DIAGNOSIS — S25101D Unspecified injury of right innominate or subclavian artery, subsequent encounter: Secondary | ICD-10-CM

## 2023-02-20 HISTORY — PX: AORTIC ARCH ANGIOGRAPHY: CATH118224

## 2023-02-20 HISTORY — PX: UPPER EXTREMITY ANGIOGRAPHY: CATH118270

## 2023-02-20 LAB — POCT I-STAT, CHEM 8
BUN: 17 mg/dL (ref 6–20)
Calcium, Ion: 1.3 mmol/L (ref 1.15–1.40)
Chloride: 101 mmol/L (ref 98–111)
Creatinine, Ser: 1.3 mg/dL — ABNORMAL HIGH (ref 0.61–1.24)
Glucose, Bld: 96 mg/dL (ref 70–99)
HCT: 48 % (ref 39.0–52.0)
Hemoglobin: 16.3 g/dL (ref 13.0–17.0)
Potassium: 4 mmol/L (ref 3.5–5.1)
Sodium: 139 mmol/L (ref 135–145)
TCO2: 30 mmol/L (ref 22–32)

## 2023-02-20 SURGERY — AORTIC ARCH ANGIOGRAPHY
Anesthesia: LOCAL

## 2023-02-20 MED ORDER — SODIUM CHLORIDE 0.9% FLUSH: 3.0000 mL | Freq: Two times a day (BID) | INTRAVENOUS | Status: DC

## 2023-02-20 MED ORDER — FENTANYL CITRATE (PF) 100 MCG/2ML IJ SOLN
INTRAMUSCULAR | Status: AC
  Filled 2023-02-20: qty 2

## 2023-02-20 MED ORDER — LABETALOL HCL 5 MG/ML IV SOLN: 10.0000 mg | INTRAVENOUS | Status: DC | PRN

## 2023-02-20 MED ORDER — FENTANYL CITRATE (PF) 100 MCG/2ML IJ SOLN
INTRAMUSCULAR | Status: DC | PRN
  Administered 2023-02-20 (×2): 50 ug via INTRAVENOUS

## 2023-02-20 MED ORDER — LIDOCAINE HCL (PF) 1 % IJ SOLN
INTRAMUSCULAR | Status: DC | PRN
  Administered 2023-02-20: 10 mL

## 2023-02-20 MED ORDER — MIDAZOLAM HCL 2 MG/2ML IJ SOLN
INTRAMUSCULAR | Status: DC | PRN
  Administered 2023-02-20 (×2): 1 mg via INTRAVENOUS

## 2023-02-20 MED ORDER — IODIXANOL 320 MG/ML IV SOLN
INTRAVENOUS | Status: DC | PRN
  Administered 2023-02-20: 70 mL

## 2023-02-20 MED ORDER — SODIUM CHLORIDE 0.9% FLUSH: 3.0000 mL | INTRAVENOUS | Status: DC | PRN

## 2023-02-20 MED ORDER — SODIUM CHLORIDE 0.9 % IV SOLN: INTRAVENOUS | Status: DC

## 2023-02-20 MED ORDER — ASPIRIN 81 MG PO TBEC: 81.0000 mg | DELAYED_RELEASE_TABLET | Freq: Every day | ORAL | Status: DC

## 2023-02-20 MED ORDER — SODIUM CHLORIDE 0.9 % WEIGHT BASED INFUSION: 1.0000 mL/kg/h | INTRAVENOUS | Status: DC

## 2023-02-20 MED ORDER — MIDAZOLAM HCL 2 MG/2ML IJ SOLN
INTRAMUSCULAR | Status: AC
  Filled 2023-02-20: qty 2

## 2023-02-20 MED ORDER — SODIUM CHLORIDE 0.9 % IV SOLN: 250.0000 mL | INTRAVENOUS | Status: DC | PRN

## 2023-02-20 MED ORDER — ONDANSETRON HCL 4 MG/2ML IJ SOLN: 4.0000 mg | Freq: Four times a day (QID) | INTRAMUSCULAR | Status: DC | PRN

## 2023-02-20 MED ORDER — HYDRALAZINE HCL 20 MG/ML IJ SOLN: 5.0000 mg | INTRAMUSCULAR | Status: DC | PRN

## 2023-02-20 MED ORDER — HEPARIN (PORCINE) IN NACL 1000-0.9 UT/500ML-% IV SOLN
INTRAVENOUS | Status: DC | PRN
  Administered 2023-02-20 (×2): 500 mL

## 2023-02-20 MED ORDER — LIDOCAINE HCL (PF) 1 % IJ SOLN
INTRAMUSCULAR | Status: AC
  Filled 2023-02-20: qty 30

## 2023-02-20 MED ORDER — ACETAMINOPHEN 325 MG PO TABS: 650.0000 mg | ORAL_TABLET | ORAL | Status: DC | PRN

## 2023-02-20 SURGICAL SUPPLY — 15 items
CATH ANGIO 5F BER 100CM (CATHETERS) IMPLANT
CATH ANGIO 5F PIGTAIL 100CM (CATHETERS) IMPLANT
CATH HEADHUNTER 5F 125CM (CATHETERS) IMPLANT
CATH SOFT-VU ST 4F 90CM (CATHETERS) IMPLANT
GUIDEWIRE ANGLED .035X260CM (WIRE) IMPLANT
KIT MICROPUNCTURE NIT STIFF (SHEATH) IMPLANT
KIT PV (KITS) IMPLANT
KIT SINGLE USE MANIFOLD (KITS) IMPLANT
SET ATX-X65L (MISCELLANEOUS) IMPLANT
SHEATH PINNACLE 5F 10CM (SHEATH) IMPLANT
SHEATH PROBE COVER 6X72 (BAG) IMPLANT
TRANSDUCER W/STOPCOCK (MISCELLANEOUS) ×1 IMPLANT
TRAY PV CATH (CUSTOM PROCEDURE TRAY) ×1 IMPLANT
WIRE BENTSON .035X145CM (WIRE) IMPLANT
WIRE HI TORQ VERSACORE 300 (WIRE) IMPLANT

## 2023-02-20 NOTE — Interval H&P Note (Signed)
History and Physical Interval Note:  02/20/2023 10:06 AM  Robert Good  has presented today for surgery, with the diagnosis of injury of right subclavian artery.  The various methods of treatment have been discussed with the patient and family. After consideration of risks, benefits and other options for treatment, the patient has consented to  Procedure(s): AORTIC ARCH ANGIOGRAPHY (N/A) Upper Extremity Angiography (N/A) as a surgical intervention.  The patient's history has been reviewed, patient examined, no change in status, stable for surgery.  I have reviewed the patient's chart and labs.  Questions were answered to the patient's satisfaction.     Waverly Ferrari
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
## 2023-02-20 NOTE — Progress Notes (Signed)
Patient informed this RN that they are taking Gabapentin(800mg  po) four times daily at home. This RN paged and spoke with Dr. Jacquelin Hawking to verify if it would be okay. Dr. Edilia Bo gave verbal verification over the phone that it would be okay for patient to take home dose. Patient has been informed.

## 2023-02-21 ENCOUNTER — Encounter (HOSPITAL_COMMUNITY): Payer: Self-pay | Admitting: Vascular Surgery
